# Patient Record
Sex: Male | Born: 1960 | Race: White | Hispanic: No | Marital: Married | Smoking: Never smoker
Health system: Southern US, Community
[De-identification: ages and names within clinical notes are randomized; demographics above are authoritative.]

## PROBLEM LIST (undated history)

## (undated) DIAGNOSIS — J309 Allergic rhinitis, unspecified: Secondary | ICD-10-CM

## (undated) DIAGNOSIS — R55 Syncope and collapse: Secondary | ICD-10-CM

## (undated) DIAGNOSIS — E785 Hyperlipidemia, unspecified: Secondary | ICD-10-CM

## (undated) DIAGNOSIS — T7840XA Allergy, unspecified, initial encounter: Secondary | ICD-10-CM

## (undated) HISTORY — PX: VASECTOMY: SHX75

## (undated) HISTORY — DX: Hyperlipidemia, unspecified: E78.5

## (undated) HISTORY — PX: OTHER SURGICAL HISTORY: SHX169

## (undated) HISTORY — DX: Allergy, unspecified, initial encounter: T78.40XA

## (undated) HISTORY — DX: Allergic rhinitis, unspecified: J30.9

## (undated) HISTORY — PX: COLONOSCOPY: SHX174

## (undated) HISTORY — DX: Syncope and collapse: R55

---

## 2004-08-28 ENCOUNTER — Ambulatory Visit: Payer: Self-pay | Admitting: Internal Medicine

## 2004-09-18 ENCOUNTER — Ambulatory Visit: Payer: Self-pay | Admitting: Internal Medicine

## 2004-09-27 ENCOUNTER — Encounter: Admission: RE | Admit: 2004-09-27 | Discharge: 2004-12-26 | Payer: Self-pay | Admitting: Internal Medicine

## 2004-11-18 ENCOUNTER — Ambulatory Visit: Payer: Self-pay | Admitting: Internal Medicine

## 2005-02-20 ENCOUNTER — Ambulatory Visit: Payer: Self-pay | Admitting: Gastroenterology

## 2005-03-17 ENCOUNTER — Ambulatory Visit: Payer: Self-pay | Admitting: Gastroenterology

## 2005-07-09 ENCOUNTER — Ambulatory Visit: Payer: Self-pay | Admitting: Internal Medicine

## 2005-10-24 ENCOUNTER — Ambulatory Visit: Payer: Self-pay | Admitting: Internal Medicine

## 2005-10-28 ENCOUNTER — Ambulatory Visit: Payer: Self-pay | Admitting: Internal Medicine

## 2005-11-04 ENCOUNTER — Ambulatory Visit: Payer: Self-pay | Admitting: Internal Medicine

## 2006-02-04 ENCOUNTER — Ambulatory Visit: Payer: Self-pay | Admitting: Internal Medicine

## 2006-03-27 ENCOUNTER — Ambulatory Visit: Payer: Self-pay | Admitting: Internal Medicine

## 2006-05-01 ENCOUNTER — Ambulatory Visit: Payer: Self-pay | Admitting: Internal Medicine

## 2007-01-04 ENCOUNTER — Ambulatory Visit: Payer: Self-pay | Admitting: Internal Medicine

## 2007-01-04 LAB — CONVERTED CEMR LAB
AST: 28 units/L (ref 0–37)
Albumin: 3.7 g/dL (ref 3.5–5.2)
Alkaline Phosphatase: 48 units/L (ref 39–117)
BUN: 12 mg/dL (ref 6–23)
Basophils Relative: 0.5 % (ref 0.0–1.0)
CO2: 30 meq/L (ref 19–32)
Chloride: 109 meq/L (ref 96–112)
Creatinine, Ser: 0.9 mg/dL (ref 0.4–1.5)
HCT: 43.4 % (ref 39.0–52.0)
Hemoglobin: 15.1 g/dL (ref 13.0–17.0)
Ketones, ur: NEGATIVE mg/dL
Leukocytes, UA: NEGATIVE
Monocytes Absolute: 0.4 10*3/uL (ref 0.2–0.7)
Neutrophils Relative %: 38.5 % — ABNORMAL LOW (ref 43.0–77.0)
Nitrite: NEGATIVE
Potassium: 3.7 meq/L (ref 3.5–5.1)
RBC: 4.77 M/uL (ref 4.22–5.81)
RDW: 12.2 % (ref 11.5–14.6)
Specific Gravity, Urine: 1.025 (ref 1.000–1.03)
TSH: 1.22 microintl units/mL (ref 0.35–5.50)
Total Bilirubin: 1.1 mg/dL (ref 0.3–1.2)
Total Protein, Urine: NEGATIVE mg/dL
Total Protein: 6.2 g/dL (ref 6.0–8.3)
Urobilinogen, UA: 0.2 (ref 0.0–1.0)
VLDL: 17 mg/dL (ref 0–40)
pH: 6 (ref 5.0–8.0)

## 2007-01-07 ENCOUNTER — Ambulatory Visit: Payer: Self-pay | Admitting: Internal Medicine

## 2007-05-18 ENCOUNTER — Encounter: Payer: Self-pay | Admitting: Internal Medicine

## 2007-05-18 DIAGNOSIS — J309 Allergic rhinitis, unspecified: Secondary | ICD-10-CM

## 2007-05-18 DIAGNOSIS — E785 Hyperlipidemia, unspecified: Secondary | ICD-10-CM

## 2007-05-18 HISTORY — DX: Hyperlipidemia, unspecified: E78.5

## 2007-05-18 HISTORY — DX: Allergic rhinitis, unspecified: J30.9

## 2007-09-29 ENCOUNTER — Ambulatory Visit: Payer: Self-pay | Admitting: Internal Medicine

## 2007-09-29 DIAGNOSIS — N453 Epididymo-orchitis: Secondary | ICD-10-CM | POA: Insufficient documentation

## 2008-02-10 ENCOUNTER — Ambulatory Visit: Payer: Self-pay | Admitting: Internal Medicine

## 2008-02-10 LAB — CONVERTED CEMR LAB
ALT: 16 units/L (ref 0–53)
AST: 21 units/L (ref 0–37)
Albumin: 4.2 g/dL (ref 3.5–5.2)
Alkaline Phosphatase: 58 units/L (ref 39–117)
BUN: 13 mg/dL (ref 6–23)
Basophils Relative: 1 % (ref 0.0–1.0)
CO2: 30 meq/L (ref 19–32)
Chloride: 103 meq/L (ref 96–112)
Creatinine, Ser: 0.9 mg/dL (ref 0.4–1.5)
Eosinophils Absolute: 0.3 10*3/uL (ref 0.0–0.7)
Eosinophils Relative: 5.7 % — ABNORMAL HIGH (ref 0.0–5.0)
GFR calc non Af Amer: 97 mL/min
HDL: 31.2 mg/dL — ABNORMAL LOW (ref >39.0)
Hemoglobin, Urine: NEGATIVE
Leukocytes, UA: NEGATIVE
Lymphocytes Relative: 41.2 % (ref 12.0–46.0)
MCV: 90.5 fL (ref 78.0–100.0)
Monocytes Relative: 8.5 % (ref 3.0–12.0)
Neutrophils Relative %: 43.6 % (ref 43.0–77.0)
Nitrite: NEGATIVE
Platelets: 270 10*3/uL (ref 150–400)
Potassium: 4.3 meq/L (ref 3.5–5.1)
RBC: 5.25 M/uL (ref 4.22–5.81)
Specific Gravity, Urine: <=1.005 (ref 1.000–1.03)
Total CHOL/HDL Ratio: 7.7
Urine Glucose: NEGATIVE mg/dL
Urobilinogen, UA: 0.2 (ref 0.0–1.0)
VLDL: 27 mg/dL (ref 0–40)
WBC: 4.9 10*3/uL (ref 4.5–10.5)

## 2008-02-18 ENCOUNTER — Ambulatory Visit: Payer: Self-pay | Admitting: Internal Medicine

## 2008-09-25 ENCOUNTER — Telehealth: Payer: Self-pay | Admitting: Internal Medicine

## 2008-11-15 ENCOUNTER — Ambulatory Visit: Payer: Self-pay | Admitting: Internal Medicine

## 2008-11-15 DIAGNOSIS — J019 Acute sinusitis, unspecified: Secondary | ICD-10-CM

## 2009-02-20 ENCOUNTER — Ambulatory Visit: Payer: Self-pay | Admitting: Internal Medicine

## 2009-02-20 LAB — CONVERTED CEMR LAB
ALT: 20 units/L (ref 0–53)
AST: 26 units/L (ref 0–37)
Alkaline Phosphatase: 49 units/L (ref 39–117)
BUN: 11 mg/dL (ref 6–23)
Bilirubin Urine: NEGATIVE
Bilirubin, Direct: 0.2 mg/dL (ref 0.0–0.3)
CO2: 31 meq/L (ref 19–32)
Eosinophils Relative: 8.1 % — ABNORMAL HIGH (ref 0.0–5.0)
Glucose, Bld: 100 mg/dL — ABNORMAL HIGH (ref 70–99)
HDL: 41.6 mg/dL (ref >39.00)
Ketones, ur: NEGATIVE mg/dL
Lymphocytes Relative: 36.4 % (ref 12.0–46.0)
Monocytes Relative: 9 % (ref 3.0–12.0)
Neutrophils Relative %: 46 % (ref 43.0–77.0)
Nitrite: NEGATIVE
Platelets: 242 10*3/uL (ref 150.0–400.0)
Potassium: 4.2 meq/L (ref 3.5–5.1)
RBC: 4.86 M/uL (ref 4.22–5.81)
Sodium: 141 meq/L (ref 135–145)
Total Bilirubin: 1.1 mg/dL (ref 0.3–1.2)
Total CHOL/HDL Ratio: 4
Total Protein, Urine: NEGATIVE mg/dL
Urine Glucose: NEGATIVE mg/dL
VLDL: 20.6 mg/dL (ref 0.0–40.0)
WBC: 5.8 10*3/uL (ref 4.5–10.5)
pH: 7 (ref 5.0–8.0)

## 2009-02-23 ENCOUNTER — Ambulatory Visit: Payer: Self-pay | Admitting: Internal Medicine

## 2009-09-26 ENCOUNTER — Telehealth: Payer: Self-pay | Admitting: Internal Medicine

## 2010-03-04 ENCOUNTER — Encounter (INDEPENDENT_AMBULATORY_CARE_PROVIDER_SITE_OTHER): Payer: Self-pay | Admitting: *Deleted

## 2010-06-25 ENCOUNTER — Ambulatory Visit: Payer: Self-pay | Admitting: Internal Medicine

## 2010-06-25 LAB — CONVERTED CEMR LAB
ALT: 14 units/L (ref 0–53)
BUN: 15 mg/dL (ref 6–23)
Basophils Relative: 0.7 % (ref 0.0–3.0)
CO2: 31 meq/L (ref 19–32)
Chloride: 105 meq/L (ref 96–112)
Cholesterol: 208 mg/dL — ABNORMAL HIGH (ref 0–200)
Direct LDL: 149.6 mg/dL
Eosinophils Relative: 5.9 % — ABNORMAL HIGH (ref 0.0–5.0)
HCT: 45.9 % (ref 39.0–52.0)
Lymphs Abs: 2.2 10*3/uL (ref 0.7–4.0)
MCV: 91.3 fL (ref 78.0–100.0)
Monocytes Absolute: 0.5 10*3/uL (ref 0.1–1.0)
Platelets: 273 10*3/uL (ref 150.0–400.0)
Potassium: 4.4 meq/L (ref 3.5–5.1)
Specific Gravity, Urine: 1.01 (ref 1.000–1.030)
Total Bilirubin: 1.1 mg/dL (ref 0.3–1.2)
Total Protein, Urine: NEGATIVE mg/dL
Total Protein: 6.9 g/dL (ref 6.0–8.3)
Triglycerides: 91 mg/dL (ref 0.0–149.0)
Urine Glucose: NEGATIVE mg/dL
WBC: 5.8 10*3/uL (ref 4.5–10.5)

## 2010-07-22 ENCOUNTER — Telehealth: Payer: Self-pay | Admitting: Internal Medicine

## 2010-09-20 ENCOUNTER — Encounter: Payer: Self-pay | Admitting: Internal Medicine

## 2010-10-22 NOTE — Progress Notes (Signed)
Summary: Lab results  Phone Note Call from Patient Call back at Home Phone [phone]   Summary of Call: Pt called for labs results because the info was not available on the phone tree. Initial call taken by: Jarome Lamas,  July 22, 2010 8:48 AM  Follow-up for Phone Call        called patient and left message for him to call back. Follow-up by: Jarome Lamas,  July 22, 2010 9:00 AM  Additional Follow-up for Phone Call Additional follow up Details #1::        Pt informed Additional Follow-up by: Margaret Pyle, CMA,  July 22, 2010 10:04 AM

## 2010-10-22 NOTE — Letter (Signed)
Summary: Colonoscopy Letter  [facility]  [address] [place], [place] [address]   Phone: [phone]  Fax: [phone]      March 04, 2010 MRN: [id number]   [facility] [address] [place], [place]  [address]   Dear Mr. [facility],   According to your medical record, it is time for you to schedule a Colonoscopy. The American Cancer Society recommends this procedure as a method to detect early colon cancer. Patients with a family history of colon cancer, or a personal history of colon polyps or inflammatory bowel disease are at increased risk.  This letter has been generated based on the recommendations made at the time of your procedure. If you feel that in your particular situation this may no longer apply, please contact our office.  Please call our office at [phone] to schedule this appointment or to update your records at your earliest convenience.  Thank you for cooperating with [place] to provide you with the very best care possible.   Sincerely,  Barbette Hair. Arlyce Dice, M.D.  [facility] [phone]

## 2010-10-22 NOTE — Assessment & Plan Note (Signed)
Summary: FU ON MEDS/ LOV 02-2009 /NWS  #   Vital Signs:  Patient profile:   50 year old male Height:      69 inches Weight:      174.50 pounds BMI:     25.86 O2 Sat:      97 % on Room air Temp:     97.9 degrees F oral Pulse rate:   72 / minute BP sitting:   100 / 70  (left arm) Cuff size:   regular  Vitals Entered By: Zella Ball Ewing CMA (AAMA) (June 25, 2010 8:07 AM)  O2 Flow:  Room air  CC: followup on medication/RE   CC:  followup on medication/RE.  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   No fever, wt loss, night sweats, loss of appetite or other constitutional symptoms    Preventive Screening-Counseling & Management      Drug Use:  no.    Problems Prior to Update: 1)  Preventive Health Care  (ICD-V70.0) 2)  Sinusitis- Acute-nos  (ICD-461.9) 3)  Preventive Health Care  (ICD-V70.0) 4)  Family History of Alcoholism/addiction  (ICD-V61.41) 5)  Orchitis  (ICD-604.90) 6)  Hyperlipidemia  (ICD-272.4) 7)  Allergic Rhinitis  (ICD-477.9)  Medications Prior to Update: 1)  Crestor 10 Mg  Tabs (Rosuvastatin Calcium) .... Once Daily 2)  Desonide 0.05 %  Oint (Desonide) 3)  Flonase 50 Mcg/act  Susp (Fluticasone Propionate) .... As Needed 4)  Zyrtec Allergy 10 Mg  Tabs (Cetirizine Hcl) .... As Needed 5)  Optivar 0.05 %  Soln (Azelastine Hcl)  Current Medications (verified): 1)  Crestor 10 Mg  Tabs (Rosuvastatin Calcium) .Marland Kitchen.. 1 By Mouth Once Daily 2)  Desonide 0.05 %  Oint (Desonide) 3)   Flonase 50 Mcg/act  Susp (Fluticasone Propionate) .... 2 Spray/side Once Daily As Needed 4)  Zyrtec Allergy 10 Mg  Tabs (Cetirizine Hcl) .... 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 Family History of Alcoholism/Addiction - father sister with allergies mother with colon cancer, PVD grandfather with prostate cancer  Social History: Reviewed history from 02/18/2008 and no changes required. 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         all otherwise negative per pt -    Physical Exam  General:  alert and well-developed.   Head:  normocephalic and atraumatic.   Eyes:  vision grossly intact, pupils equal, and  pupils round.   Ears:  R ear normal and L ear normal.   Nose:  no external deformity and no nasal discharge.   Mouth:  no gingival abnormalities and pharynx pink and moist.   Neck:  supple and no masses.   Lungs:  normal respiratory effort and normal breath sounds.   Heart:  normal rate, regular rhythm, and no gallop.   Abdomen:  soft, non-tender, and normal bowel sounds.   Msk:  no joint tenderness and no joint swelling.   Extremities:  no edema, no ulcers  Neurologic:  cranial nerves II-XII intact and strength normal in all extremities.   Skin:  color normal and no rashes.   Psych:  not anxious appearing and not depressed appearing.     Impression & Recommendations:  Problem # 1:  Preventive Health Care (ICD-V70.0)  Overall doing well, age appropriate education and counseling updated and referral for appropriate preventive services done unless declined, immunizations up to date or declined, diet counseling done if overweight, urged to quit smoking if smokes , most recent labs reviewed and current ordered if appropriate, ecg reviewed or declined (interpretation per ECG scanned in the EMR if done); information regarding Medicare Prevention requirements given if appropriate; speciality referrals updated as appropriate   Orders: TLB-BMP (Basic Metabolic Panel-BMET) (80048-METABOL) TLB-CBC Platelet - w/Differential (85025-CBCD) TLB-Hepatic/Liver Function Pnl (80076-HEPATIC) TLB-Lipid Panel (80061-LIPID) TLB-PSA (Prostate Specific Antigen) (84153-PSA) TLB-TSH (Thyroid Stimulating Hormone) (84443-TSH) TLB-Udip w/ Micro (81001-URINE) Gastroenterology Referral (GI)  Problem # 2:  HYPERLIPIDEMIA (ICD-272.4)  His updated medication list for this problem includes:    Crestor 10 Mg Tabs (Rosuvastatin calcium) .Marland Kitchen... 1 by mouth once daily has been out of crestor for 4 days, to Continue all previous medications as before this visit , Pt to continue diet efforts, good med tolerance; to check labs  - goal LDL less than 100  Complete Medication List: 1)  Crestor 10 Mg Tabs (Rosuvastatin calcium) .Marland Kitchen.. 1 by mouth once daily 2)  Desonide 0.05 % Oint (Desonide) 3)  Flonase 50 Mcg/act Susp (Fluticasone propionate) .... 2 spray/side once daily as needed 4)  Zyrtec Allergy 10 Mg Tabs (Cetirizine hcl) .... As needed 5)  Optivar 0.05 % Soln (Azelastine hcl)  Other Orders: Admin 1st Vaccine (01751) Flu Vaccine 54yrs + (02585)  Patient Instructions: 1)  you had the flu shot today 2)  You will be contacted about the referral(s) to: colonoscopy 3)  Please go to the Lab in the basement for your blood and/or urine tests today 4)  Please call the number on the [facility] Card for results of your testing 5)  Continue all previous medications as before this visit  6)  Please schedule a follow-up appointment in 1 year or sooner if needed Prescriptions: CRESTOR 10 MG  TABS (ROSUVASTATIN CALCIUM) 1 by mouth once daily  #90 x 3   Entered and Authorized by:   Corwin Levins MD   Signed by:   Corwin Levins MD on 06/25/2010   Method used:   Electronically to        [facility] [address] (retail)  [address]       [place], [place]  [address]       Ph: [phone]       Fax: [phone]   RxID:   [id number] FLONASE 50 MCG/ACT  SUSP (FLUTICASONE PROPIONATE) 2 spray/side once daily as needed  #3 x 3   Entered and Authorized by:   Corwin Levins MD   Signed by:   Corwin Levins MD on 06/25/2010   Method used:   Print then Give to Patient   RxID:   [id number] CRESTOR 10 MG  TABS (ROSUVASTATIN CALCIUM) 1 by mouth once daily  #90 x 3   Entered and Authorized by:   Corwin Levins MD   Signed by:   Corwin Levins MD on 06/25/2010   Method used:   Print then Give to Patient   RxID:   [id number]   Flu Vaccine Consent Questions     Do you have a history of severe allergic reactions to this vaccine? no    Any prior history of allergic reactions to egg and/or  gelatin? no    Do you have a sensitivity to the preservative Thimersol? no    Do you have a past history of Guillan-Barre Syndrome? no    Do you currently have an acute febrile illness? no    Have you ever had a severe reaction to latex? no    Vaccine information given and explained to patient? yes    Are you currently pregnant? no    Lot Number:[id number]   Exp Date:03/22/2011   Site Given  Left Deltoid IMbflu

## 2010-10-22 NOTE — Progress Notes (Signed)
Summary: Rx request  Phone Note Call from Patient   Caller: Patient [phone] Summary of Call: pt called requesting new rx for Crestor be sent to local pharmacy instead of [facility] due to Insurance change. FRx sent, pt informed Initial call taken by: Margaret Pyle, CMA,  September 26, 2009 12:03 PM    Prescriptions: CRESTOR 10 MG  TABS (ROSUVASTATIN CALCIUM) once daily  #30 x 5   Entered by:   Margaret Pyle, CMA   Authorized by:   Corwin Levins MD   Signed by:   Margaret Pyle, CMA on 09/26/2009   Method used:   Electronically to        [facility] [address] (retail)       [address]       [place], [place]  [address]       Ph: [phone]       Fax: [phone]   RxID:   [id number] CRESTOR 10 MG  TABS (ROSUVASTATIN CALCIUM) once daily  #90 x 1   Entered by:   Margaret Pyle, CMA   Authorized by:   Corwin Levins MD   Signed by:   Margaret Pyle, CMA on 09/26/2009   Method used:   Electronically to        [facility] [address] (retail)       [address]       [place], [place]  [address]       Ph: [phone]       Fax: [phone]   RxID:   [id number]

## 2010-10-24 NOTE — Letter (Signed)
Summary: Referral - not able to see patient  [facility]  [address] [place], [place] [address]   Phone: [phone]  Fax: [phone]    September 20, 2010   Corwin Levins, M.D. [address] [place], [place] [address]   Re:   Richard Morrow DOB:  12-Oct-1960 MRN:   [id number]    Dear Dr. Jonny Ruiz:  Thank you for your kind referral of the above patient.  We have attempted to schedule the recommended procedure Screening Colonoscopy but have not been able to schedule because:   X  The patient was not available by phone and/or has not returned our calls.  ___ The patient declined to schedule the procedure at this time.  We appreciate the referral and hope that we will have the opportunity to treat this patient in the future.    Sincerely,    [facility] [phone]

## 2011-02-07 NOTE — Assessment & Plan Note (Signed)
[facility]                               PULMONARY OFFICE NOTE   NAME:Richard Morrow, Richard Morrow                     MRN:          [id number]  DATE:05/01/2006                            DOB:          05-22-1961    PROBLEM:  1. Chronic atopic dermatitis.  2. Allergic rhinitis.   HISTORY:  This gentleman returns for followup of primary complaint of rash  or burning on his lips.  He had been particularly concerned about the  possibility of a chronic yeast infection on his skin.  Skin testing had been  strongly positive for common inhalant allergens.  He stopped using the  Bounce fabric softer sheets in the dryer and has significantly cut-down on  his beer intake.  He again points out that his father is a heavy beer  drinker with a similar tendency towards dry-scaling skin, almost sounding  like ichthyosis.  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  He is using a Vaseline lip ointment which helps  substantially.  We reviewed exposure to drying agents including  antihistamines, emphasizing that if he needs this for nasal congestion and  nasal discharge he should use it intermittently or try other approaches.  He  is satisfied that his nasal congestion currently is doing well, recognizing  that this is not a peak allergy season.   MEDICATION:  1. Crestor.  2. Desonide ointment.  3. Triamcinolone 0.1% cream used occasionally.  4. Fish Oil.  5. Flonase.  6. Ketoconazole shampoo.  7. Zyrtec.  8. Optivar.   ALLERGIES:  No medication allergy.   OBJECTIVE:  SKIN:  His skin looks unremarkable.  Maybe just slightly on the  dry side but not cosmetically noticeable.  I do not notice any abnormality  of the lips.  His tongue is much less coated and seems to be clearing  compared with last visit.  VITAL SIGNS:  Weight 174 pounds.  Blood pressure  122/88.  Pulse regular 93.  Room air saturation 97%.  GENERAL:  He is a fit-appearing, comfortable appearing young-man.  HEENT:  Conjunctivae and nasal mucosa looked normal.  LUNGS:  Clear.  Pulse regular.   LABORATORY:  IgE level was within broad normal at 145.6 (4-098) but  certainly in a range consistent with atopic allergic response. The screen  for antibodies to common skin fungal agents often associated with allergic  dermatitis (Pityrosporum/Malassezia) was essentially negative/normal.  His  CBC in February had been unremarkable with a normal differential at that  time.   IMPRESSION:  1. Atopic dermatitis.  2. Dry skin.  3. Suspect an aggravating component related especially to steady beer      intake, improved since that has changed.  4. Seasonal allergic rhinitis with significant skin test positivity.   PLAN:  1. Environmental allergen exposure discussion.  2. Continue to minimize beer and alcohol ingestion.  3. Use Zyrtec only as needed to avoid over-drying.  4. Continue routine use of skin moisturizers.  5.  Try sample Singulair for seven days, recognizing that he is relatively      asymptomatic now and may not have much capacity to respond.  This can      be revisited later as needed.  6. For now I would be happy to see him again as needed.                                   Clinton D. Maple Hudson, MD, FCCP, FACP   CDY/MedQ  DD:  05/03/2006  DT:  05/04/2006  Job #:  [id number]

## 2011-02-07 NOTE — Assessment & Plan Note (Signed)
[facility]                               PULMONARY OFFICE NOTE   NAME:Richard Morrow, Richard Morrow                     MRN:          [id number]  DATE:03/27/2006                            DOB:          [birth year]    PROBLEM:  This is a 50 year old nonsmoker seen at the kind referral of Dr.  Jonny Ruiz in allergy consultation, concerned about a rash on his lips, persistent  or recurrent yeast infection in his throat and body surfaces.   HISTORY:  He says for 2 years or more he has been fighting yeast on his  scalp, groin, face, and lips.  His lips get swollen and red, especially in  the past year.  He saw Dr. Jorja Loa for dermatology evaluation, and patch  tests were negative.  He says skin biopsy diagnosed yeast.  He is worse in  the winter.  He wondered if there was any relation either to peanut butter  or a hair gel that he uses but was never able to demonstrate a connection.  He has had some history of allergic rhinitis, mostly perennial but also  seasonal for which he uses antihistamines.  He had dandruff as a child.  In  the past but no longer he says he had some discomfort eating shellfish with  tight feeling in his throat and tingling in his ears.  There has been no  history of asthma, but he does say that he has had eczema.   REVIEW OF SYSTEMS:  He denies shortness of breath, bleeding, adenopathy,  fever, sweats, hot swollen joints or change in weight, bowels or bladder.  Occasional nasal congestion.  He does not remember any adult Immunizations  except tetanus shots some years ago.  He denies any at-risk lifestyle or  potential exposure to HIV.   PAST MEDICAL HISTORY:  1.  Elevated cholesterol.  2.  Allergic rhinitis.  3.  He was evaluated in the past for depression.  4.  Treated with Depo-Medrol and prednisone for complaints of itching and      burning eyes in May of this year when he was also given Zyrtec, Flonase,      and Optivar.  5.  He has been  treated for chronica tinea, especially while on amoxicillin      and has had several visits because of skin rashes.   SOCIAL HISTORY:  Never smokes.  Drinks 2 beers a day, more on weekends,  otherwise Diet Coke.  He is married with children.  Works as a Health and safety inspector.  He had traveled by car to [place] and [place] in June and  was in [place] in May of last year.  He exercises and lifts weights  regularly.   FAMILY HISTORY:  Father had a history of dry skin, heavy alcohol use.  Sister with allergic rhinitis.   MEDICATIONS:  1.  Crestor.  2.  Desonide ointment.  3.  Triamcinolone cream 0.1%.  4.  Multivitamins.  5.  Fish oil.  6.  Ketoconazole shampoo.  7.  Clotrimazole.  8.  Betamethasone cream.  9.  Zyrtec.  10. Optivar.   ALLERGIES:  No medication allergy known.   OBJECTIVE:  VITAL SIGNS: Weight 172 pounds.  Blood pressure 100/72, pulse  regular at 80, room air saturation 98%.  GENERAL:  This is a muscular, fit man in no apparent distress.  HEENT: There is minimal grain to the skin immediately around his eyes, but  no scale or erythema, no conjunctival injection or thickening.  His tongue  is coated, especially posteriorly.  I cannot exclude some thrush by  appearance.  Pharynx is not red.  Voice quality is normal.  NECK:  There is no adenopathy.  LUNGS:  Clear to percussion and auscultation.  CARDIAC: Heart sounds are regular without murmur or gallop.  EXTREMITIES: There is no pitting of nails.   Skin test:  Positive histamine, negative diluent controls.  Strong positive  intradermal reaction, particularly to grass and weed pollens and  endodermals.  Significant intradermal test, particularly dust mite.  He had  a weak reaction to shellfish; otherwise the food test screening panel was  negative.   IMPRESSION:  1.  Probably chronic atopic dermatitis or mild eczema rather than a chronic      primary cutaneous yeast infection unless it turns out that he has a       limited immune deficiency such as common variable immunodeficiency      (SVID).  2.  Allergic rhinitis.  3.  Possible thrush.  4.  If he develops new problems, I will be tempted to get an HIV assay,      although he denies risk.   PLAN:  1.  He is going to have his wife stop using Bounce fabric softener sheets in      the drier because of the bacterial enzymes with potential      sensibilization.  2.  Use Neutrogena as a bath soap.  3.  IgE level.  4.  Specific IgE for Malassezia furfur (P. [place]).  5.  Dust and environmental precautions.  6.  He was given a Clarinex after the allergy testing today.  7.  Schedule return in 1 month, earlier p.r.n.   I appreciate the chance to meet him.                                   Clinton D. Maple Hudson, MD, [facility], FACP   CDY/MedQ  DD:  04/05/2006  DT:  04/05/2006  Job #:  [id number]   cc:   Corwin Levins, MD

## 2011-05-05 ENCOUNTER — Telehealth: Payer: Self-pay | Admitting: *Deleted

## 2011-05-05 NOTE — Telephone Encounter (Signed)
LEFT MESSAGE FOR PT TO CALL BACK TO SCHEDULE

## 2011-05-12 NOTE — Telephone Encounter (Signed)
Pt will call the offcie back, works out of town it will be 2 months before he can  call back.Marland KitchenMarland Kitchen

## 2011-08-11 ENCOUNTER — Other Ambulatory Visit: Payer: Self-pay | Admitting: Internal Medicine

## 2011-12-01 ENCOUNTER — Other Ambulatory Visit: Payer: Self-pay | Admitting: Internal Medicine

## 2012-01-16 ENCOUNTER — Other Ambulatory Visit: Payer: Self-pay | Admitting: Internal Medicine

## 2012-01-26 ENCOUNTER — Encounter: Payer: Self-pay | Admitting: Internal Medicine

## 2012-01-26 ENCOUNTER — Other Ambulatory Visit (INDEPENDENT_AMBULATORY_CARE_PROVIDER_SITE_OTHER): Payer: Self-pay

## 2012-01-26 ENCOUNTER — Telehealth: Payer: Self-pay

## 2012-01-26 DIAGNOSIS — Z0001 Encounter for general adult medical examination with abnormal findings: Secondary | ICD-10-CM | POA: Insufficient documentation

## 2012-01-26 DIAGNOSIS — Z0000 Encounter for general adult medical examination without abnormal findings: Secondary | ICD-10-CM | POA: Insufficient documentation

## 2012-01-26 LAB — CBC WITH DIFFERENTIAL/PLATELET
Eosinophils Absolute: 0.3 10*3/uL (ref 0.0–0.7)
Lymphocytes Relative: 35.2 % (ref 12.0–46.0)
MCHC: 33.8 g/dL (ref 30.0–36.0)
MCV: 91.9 fl (ref 78.0–100.0)
Monocytes Absolute: 0.6 10*3/uL (ref 0.1–1.0)
Neutrophils Relative %: 50.3 % (ref 43.0–77.0)
Platelets: 262 10*3/uL (ref 150.0–400.0)
RBC: 5.02 Mil/uL (ref 4.22–5.81)
WBC: 6.8 10*3/uL (ref 4.5–10.5)

## 2012-01-26 LAB — URINALYSIS, ROUTINE W REFLEX MICROSCOPIC
Hgb urine dipstick: NEGATIVE
Leukocytes, UA: NEGATIVE
Urine Glucose: NEGATIVE
Urobilinogen, UA: 0.2 (ref 0.0–1.0)

## 2012-01-26 LAB — BASIC METABOLIC PANEL
Chloride: 105 mEq/L (ref 96–112)
GFR: 97.67 mL/min (ref >60.00)
Potassium: 4.4 mEq/L (ref 3.5–5.1)
Sodium: 142 mEq/L (ref 135–145)

## 2012-01-26 LAB — PSA: PSA: 1.04 ng/mL (ref 0.10–4.00)

## 2012-01-26 LAB — LIPID PANEL
HDL: 48.5 mg/dL (ref >39.00)
Total CHOL/HDL Ratio: 4

## 2012-01-26 LAB — HEPATIC FUNCTION PANEL
ALT: 18 U/L (ref 0–53)
AST: 24 U/L (ref 0–37)
Alkaline Phosphatase: 52 U/L (ref 39–117)
Bilirubin, Direct: 0.1 mg/dL (ref 0.0–0.3)
Total Bilirubin: 1 mg/dL (ref 0.3–1.2)

## 2012-01-26 LAB — TSH: TSH: 1.01 u[IU]/mL (ref 0.35–5.50)

## 2012-01-26 NOTE — Telephone Encounter (Signed)
Put lab order in. 

## 2012-01-28 ENCOUNTER — Encounter: Payer: Self-pay | Admitting: Internal Medicine

## 2012-01-28 ENCOUNTER — Ambulatory Visit (INDEPENDENT_AMBULATORY_CARE_PROVIDER_SITE_OTHER): Payer: BC Managed Care – PPO | Admitting: Internal Medicine

## 2012-01-28 VITALS — BP 112/82 | HR 75 | Temp 98.4°F | Ht 69.0 in | Wt 174.5 lb

## 2012-01-28 DIAGNOSIS — Z800 Family history of malignant neoplasm of digestive organs: Secondary | ICD-10-CM

## 2012-01-28 DIAGNOSIS — Z0000 Encounter for general adult medical examination without abnormal findings: Secondary | ICD-10-CM

## 2012-01-28 MED ORDER — ASPIRIN 81 MG PO TBEC: 81.0000 mg | DELAYED_RELEASE_TABLET | Freq: Every day | ORAL | Status: AC

## 2012-01-28 MED ORDER — ROSUVASTATIN CALCIUM 10 MG PO TABS: 10.0000 mg | ORAL_TABLET | Freq: Every day | ORAL | Status: DC

## 2012-01-28 NOTE — Assessment & Plan Note (Addendum)

## 2012-01-28 NOTE — Progress Notes (Signed)
Subjective:    Patient ID: Richard Morrow, male    DOB: [birth year], 51 y.o.   MRN: [id number]  HPI  Here for wellness and f/u;  Overall doing ok;  Pt denies CP, worsening SOB, DOE, wheezing, orthopnea, PND, worsening LE edema, palpitations, dizziness or syncope.  Pt denies neurological change such as new Headache, facial or extremity weakness.  Pt denies polydipsia, polyuria, or low sugar symptoms. Pt states overall good compliance with treatment and medications, good tolerability, and trying to follow lower cholesterol diet.  Pt denies worsening depressive symptoms, suicidal ideation or panic. No fever, wt loss, night sweats, loss of appetite, or other constitutional symptoms.  Pt states good ability with ADL's, low fall risk, home safety reviewed and adequate, no significant changes in hearing or vision, and occasionally active with exercise.  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   Pt denies fever, wt loss, night sweats, loss of appetite, or other constitutional symptoms Past Medical History  Diagnosis Date  . HYPERLIPIDEMIA 05/18/2007    Qualifier: Diagnosis of  By: Dance CMA (AAMA), Kim    . ALLERGIC RHINITIS 05/18/2007    Qualifier: Diagnosis of  By: Dance CMA (AAMA), Kim     Past Surgical History  Procedure Date  . Rotater cuff repair     reports that he has never smoked. He has never used smokeless tobacco. He reports that he drinks alcohol. He reports that he does not use illicit drugs. family history includes Alcohol abuse in an unspecified family member; Allergic rhinitis in his sister; Colon cancer in his mother; and Prostate cancer in an unspecified family member. No Known Allergies Current Outpatient Prescriptions on File Prior to Visit  Medication Sig Dispense Refill  . DISCONTD: CRESTOR 10 MG tablet TAKE 1 TABLET BY MOUTH ONCE DAILY  30 tablet  0   Review of Systems Review of Systems  Constitutional: Negative for  diaphoresis, activity change, appetite change and unexpected weight change.  HENT: Negative for hearing loss, ear pain, facial swelling, mouth sores and neck stiffness.   Eyes: Negative for pain, redness and visual disturbance.  Respiratory: Negative for shortness of breath and wheezing.   Cardiovascular: Negative for chest pain and palpitations.  Gastrointestinal: Negative for diarrhea, blood in stool, abdominal distention and rectal pain.  Genitourinary: Negative for hematuria, flank pain and decreased urine volume.  Musculoskeletal: Negative for myalgias and joint swelling.  Skin: Negative for color change and wound.  Neurological: Negative for syncope and numbness.  Hematological: Negative for adenopathy.  Psychiatric/Behavioral: Negative for hallucinations, self-injury, decreased concentration and agitation.      Objective:   Physical Exam BP 112/82  Pulse 75  Temp(Src) 98.4 F (36.9 C) (Oral)  Ht 5\' 9"  (1.753 m)  Wt 174 lb 8 oz (79.153 kg)  BMI 25.77 kg/m2  SpO2 98% Physical Exam  VS noted Constitutional: Pt is oriented to person, place, and time. Appears well-developed and well-nourished.  HENT:  Head: Normocephalic and atraumatic.  Right Ear: External ear normal.  Left Ear: External ear normal.  Nose: Nose normal.  Mouth/Throat: Oropharynx is clear and moist.  Eyes: Conjunctivae and EOM are normal. Pupils are equal, round, and reactive to light.  Neck: Normal range of motion. Neck supple. No JVD present. No tracheal deviation present.  Cardiovascular: Normal rate, regular rhythm, normal heart sounds and intact distal pulses.   Pulmonary/Chest: Effort normal and breath sounds normal.  Abdominal: Soft. Bowel sounds are normal. There is  no tenderness.  Musculoskeletal: Normal range of motion. Exhibits no edema.  Lymphadenopathy:  Has no cervical adenopathy.  Neurological: Pt is alert and oriented to person, place, and time. Pt has normal reflexes. No cranial nerve deficit.  Motor/sens/dtr intact Skin: Skin is warm and dry. No rash noted.  Psychiatric:  Has  normal mood and affect. Behavior is normal.     Assessment & Plan:

## 2012-01-28 NOTE — Patient Instructions (Addendum)
Please start the Aspirin at 81 mg - 1 per day - COATED only Continue all other medications as before Your refills are done today You will be contacted regarding the referral for: colonoscopy Please return in 1 year for your yearly visit, or sooner if needed, with Lab testing done 3-5 days before

## 2012-02-02 ENCOUNTER — Encounter: Payer: Self-pay | Admitting: Gastroenterology

## 2012-03-01 ENCOUNTER — Ambulatory Visit (AMBULATORY_SURGERY_CENTER): Payer: BC Managed Care – PPO | Admitting: *Deleted

## 2012-03-01 ENCOUNTER — Encounter: Payer: Self-pay | Admitting: Gastroenterology

## 2012-03-01 VITALS — Ht 69.0 in | Wt 175.0 lb

## 2012-03-01 DIAGNOSIS — Z800 Family history of malignant neoplasm of digestive organs: Secondary | ICD-10-CM

## 2012-03-01 DIAGNOSIS — Z1211 Encounter for screening for malignant neoplasm of colon: Secondary | ICD-10-CM

## 2012-03-01 MED ORDER — PEG-KCL-NACL-NASULF-NA ASC-C 100 G PO SOLR: ORAL | Status: DC

## 2012-03-01 NOTE — Progress Notes (Signed)
PATIENT STATES DURING SHOULDER SX BEFORE IV INSERTION BP DROPPED AND HIS WIFE HAD TO KEEP TALKING TO HIM. NOTE PLACED IN APPOINTMENTS INFO. RM

## 2012-03-15 ENCOUNTER — Encounter: Payer: Self-pay | Admitting: Gastroenterology

## 2012-03-15 ENCOUNTER — Ambulatory Visit (AMBULATORY_SURGERY_CENTER): Payer: BC Managed Care – PPO | Admitting: Gastroenterology

## 2012-03-15 VITALS — BP 119/74 | HR 72 | Temp 98.5°F | Resp 20 | Ht 69.0 in | Wt 175.0 lb

## 2012-03-15 DIAGNOSIS — Z800 Family history of malignant neoplasm of digestive organs: Secondary | ICD-10-CM

## 2012-03-15 DIAGNOSIS — D126 Benign neoplasm of colon, unspecified: Secondary | ICD-10-CM

## 2012-03-15 DIAGNOSIS — Z1211 Encounter for screening for malignant neoplasm of colon: Secondary | ICD-10-CM

## 2012-03-15 MED ORDER — SODIUM CHLORIDE 0.9 % IV SOLN: 500.0000 mL | INTRAVENOUS | Status: DC

## 2012-03-15 NOTE — Patient Instructions (Addendum)
YOU HAD AN ENDOSCOPIC PROCEDURE TODAY AT THE [facility]: Refer to the procedure report that was given to you for any specific questions about what was found during the examination.  If the procedure report does not answer your questions, please call your gastroenterologist to clarify.  If you requested that your care partner not be given the details of your procedure findings, then the procedure report has been included in a sealed envelope for you to review at your convenience later.  YOU SHOULD EXPECT: Some feelings of bloating in the abdomen. Passage of more gas than usual.  Walking can help get rid of the air that was put into your GI tract during the procedure and reduce the bloating. If you had a lower endoscopy (such as a colonoscopy or flexible sigmoidoscopy) you may notice spotting of blood in your stool or on the toilet paper. If you underwent a bowel prep for your procedure, then you may not have a normal bowel movement for a few days.  DIET: Your first meal following the procedure should be a light meal and then it is ok to progress to your normal diet.  A half-sandwich or bowl of soup is an example of a good first meal.  Heavy or fried foods are harder to digest and may make you feel nauseous or bloated.  Likewise meals heavy in dairy and vegetables can cause extra gas to form and this can also increase the bloating.  Drink plenty of fluids but you should avoid alcoholic beverages for 24 hours.  ACTIVITY: Your care partner should take you home directly after the procedure.  You should plan to take it easy, moving slowly for the rest of the day.  You can resume normal activity the day after the procedure however you should NOT DRIVE or use heavy machinery for 24 hours (because of the sedation medicines used during the test).    SYMPTOMS TO REPORT IMMEDIATELY: A gastroenterologist can be reached at any hour.  During normal business hours, 8:30 AM to 5:00 PM Monday through Friday,  call [phone].  After hours and on weekends, please call the GI answering service at [phone] who will take a message and have the physician on call contact you.   Following lower endoscopy (colonoscopy or flexible sigmoidoscopy):  Excessive amounts of blood in the stool  Significant tenderness or worsening of abdominal pains  Swelling of the abdomen that is new, acute  Fever of 100F or higher  Following upper endoscopy (EGD)  Vomiting of blood or coffee ground material  New chest pain or pain under the shoulder blades  Painful or persistently difficult swallowing  New shortness of breath  Fever of 100F or higher  Black, tarry-looking stools  FOLLOW UP: If any biopsies were taken you will be contacted by phone or by letter within the next 1-3 weeks.  Call your gastroenterologist if you have not heard about the biopsies in 3 weeks.  Our staff will call the home number listed on your records the next business day following your procedure to check on you and address any questions or concerns that you may have at that time regarding the information given to you following your procedure. This is a courtesy call and so if there is no answer at the home number and we have not heard from you through the emergency physician on call, we will assume that you have returned to your regular daily activities without incident.  SIGNATURES/CONFIDENTIALITY: You and/or your care   partner have signed paperwork which will be entered into your electronic medical record.  These signatures attest to the fact that that the information above on your After Visit Summary has been reviewed and is understood.  Full responsibility of the confidentiality of this discharge information lies with you and/or your care-partner.  

## 2012-03-15 NOTE — Op Note (Signed)
[facility] [address] [place], [place]  [address]  COLONOSCOPY PROCEDURE REPORT  PATIENT:  Richard, Morrow  MR#:  [id number] BIRTHDATE:  1961/04/05, 50 yrs. old  GENDER:  male ENDOSCOPIST:  Richard Hair. Arlyce Dice, MD REF. BY: PROCEDURE DATE:  03/15/2012 PROCEDURE:  Colonoscopy with snare polypectomy, Colon with cold biopsy polypectomy ASA CLASS:  Class I INDICATIONS:  Screening, family history of colon cancer Mother MEDICATIONS:   MAC sedation, administered by CRNA propofol 250mg IV  DESCRIPTION OF PROCEDURE:   After the risks benefits and alternatives of the procedure were thoroughly explained, informed consent was obtained.  Digital rectal exam was performed and revealed no abnormalities.   The LB [id number]-[id number] endoscope was introduced through the anus and advanced to the cecum, which was identified by both the appendix and ileocecal valve, without limitations.  The quality of the prep was excellent, using MiraLax.  The instrument was then slowly withdrawn as the colon was fully examined. <<PROCEDUREIMAGES>>  FINDINGS:  A sessile polyp was found in the sigmoid colon. It was 10 mm in size. It was found 20 cm from the point of entry. Polyp was snared, then cauterized with monopolar cautery. Retrieval was successful (see image7). snare polyp  A sessile polyp was found in the descending colon. It was 3 mm in size. Polyp was snared without cautery. Retrieval was successful (see image1). snare polyp  A diminutive polyp was found in the descending colon. It was 2 mm in size. The polyp was removed using cold biopsy forceps (see image6).  This was otherwise a normal examination of the colon (see image3 and image8).   Retroflexed views in the rectum revealed no abnormalities.    The time to cecum =  1) 3.0 minutes. The scope was then withdrawn in  1) 7.25  minutes from the cecum and the procedure completed. COMPLICATIONS:  None ENDOSCOPIC IMPRESSION: 1) 10 mm sessile  polyp in the sigmoid colon 2) 3 mm sessile polyp in the descending colon 3) 2 mm diminutive polyp in the descending colon 4) Otherwise normal examination RECOMMENDATIONS: 1) Given your significant family history of colon cancer, you should have a repeat colonoscopy in 5 years REPEAT EXAM:  In 5 year(s) for Colonoscopy.  ______________________________ Richard Hair. Arlyce Dice, MD  CC:  Corwin Levins, MD  n. Rosalie Doctor:   Richard Hair. Demarko Zeimet at 03/15/2012 09:17 AM  Clementeen Hoof, [id number]

## 2012-03-15 NOTE — Progress Notes (Signed)
Patient did not experience any of the following events: a burn prior to discharge; a fall within the facility; wrong site/side/patient/procedure/implant event; or a hospital transfer or hospital admission upon discharge from the facility. (G8907) Patient did not have preoperative order for IV antibiotic SSI prophylaxis. (G8918)  

## 2012-03-15 NOTE — Progress Notes (Signed)
Propofol administered per Silverio Decamp CRNA

## 2012-03-16 ENCOUNTER — Telehealth: Payer: Self-pay

## 2012-03-16 NOTE — Telephone Encounter (Signed)
Left message

## 2012-03-18 ENCOUNTER — Encounter: Payer: Self-pay | Admitting: Gastroenterology

## 2012-07-05 ENCOUNTER — Telehealth: Payer: Self-pay

## 2012-07-05 MED ORDER — ATORVASTATIN CALCIUM 20 MG PO TABS: 20.0000 mg | ORAL_TABLET | Freq: Every day | ORAL | Status: DC

## 2012-07-05 NOTE — Telephone Encounter (Signed)
Patient informed. 

## 2012-07-05 NOTE — Telephone Encounter (Signed)
Done erx - changed to lipitor 20

## 2012-07-05 NOTE — Telephone Encounter (Signed)
Pt called requesting a generic alternative to Crestor due to insurance change.

## 2013-09-27 ENCOUNTER — Encounter: Payer: Self-pay | Admitting: Internal Medicine

## 2013-09-30 ENCOUNTER — Ambulatory Visit (INDEPENDENT_AMBULATORY_CARE_PROVIDER_SITE_OTHER): Payer: BC Managed Care – PPO | Admitting: Internal Medicine

## 2013-09-30 ENCOUNTER — Encounter: Payer: Self-pay | Admitting: Internal Medicine

## 2013-09-30 ENCOUNTER — Other Ambulatory Visit (INDEPENDENT_AMBULATORY_CARE_PROVIDER_SITE_OTHER): Payer: BC Managed Care – PPO

## 2013-09-30 VITALS — BP 124/84 | HR 70 | Temp 97.8°F | Ht 70.0 in | Wt 176.0 lb

## 2013-09-30 DIAGNOSIS — E785 Hyperlipidemia, unspecified: Secondary | ICD-10-CM

## 2013-09-30 DIAGNOSIS — Z0000 Encounter for general adult medical examination without abnormal findings: Secondary | ICD-10-CM

## 2013-09-30 LAB — BASIC METABOLIC PANEL
BUN: 18 mg/dL (ref 6–23)
CHLORIDE: 104 meq/L (ref 96–112)
CO2: 30 mEq/L (ref 19–32)
Calcium: 9.5 mg/dL (ref 8.4–10.5)
Creatinine, Ser: 1.2 mg/dL (ref 0.4–1.5)
GFR: 69.15 mL/min (ref >60.00)
GLUCOSE: 85 mg/dL (ref 70–99)
POTASSIUM: 4.2 meq/L (ref 3.5–5.1)
Sodium: 140 mEq/L (ref 135–145)

## 2013-09-30 LAB — HEPATIC FUNCTION PANEL
ALT: 17 U/L (ref 0–53)
AST: 18 U/L (ref 0–37)
Albumin: 4.6 g/dL (ref 3.5–5.2)
Alkaline Phosphatase: 54 U/L (ref 39–117)
Bilirubin, Direct: 0.1 mg/dL (ref 0.0–0.3)
TOTAL PROTEIN: 7.3 g/dL (ref 6.0–8.3)
Total Bilirubin: 0.8 mg/dL (ref 0.3–1.2)

## 2013-09-30 LAB — CBC WITH DIFFERENTIAL/PLATELET
BASOS PCT: 0.5 % (ref 0.0–3.0)
Basophils Absolute: 0 10*3/uL (ref 0.0–0.1)
EOS ABS: 0.3 10*3/uL (ref 0.0–0.7)
Eosinophils Relative: 4.5 % (ref 0.0–5.0)
HCT: 46.1 % (ref 39.0–52.0)
Hemoglobin: 15.8 g/dL (ref 13.0–17.0)
LYMPHS PCT: 42 % (ref 12.0–46.0)
Lymphs Abs: 2.8 10*3/uL (ref 0.7–4.0)
MCHC: 34.3 g/dL (ref 30.0–36.0)
MCV: 90.4 fl (ref 78.0–100.0)
MONOS PCT: 8.7 % (ref 3.0–12.0)
Monocytes Absolute: 0.6 10*3/uL (ref 0.1–1.0)
Neutro Abs: 2.9 10*3/uL (ref 1.4–7.7)
Neutrophils Relative %: 44.3 % (ref 43.0–77.0)
Platelets: 303 10*3/uL (ref 150.0–400.0)
RBC: 5.11 Mil/uL (ref 4.22–5.81)
RDW: 13 % (ref 11.5–14.6)
WBC: 6.7 10*3/uL (ref 4.5–10.5)

## 2013-09-30 LAB — LIPID PANEL
CHOLESTEROL: 170 mg/dL (ref 0–200)
HDL: 39.3 mg/dL (ref >39.00)
LDL Cholesterol: 112 mg/dL — ABNORMAL HIGH (ref 0–99)
Total CHOL/HDL Ratio: 4
Triglycerides: 93 mg/dL (ref 0.0–149.0)
VLDL: 18.6 mg/dL (ref 0.0–40.0)

## 2013-09-30 LAB — URINALYSIS, ROUTINE W REFLEX MICROSCOPIC
Bilirubin Urine: NEGATIVE
Hgb urine dipstick: NEGATIVE
KETONES UR: NEGATIVE
LEUKOCYTES UA: NEGATIVE
Nitrite: NEGATIVE
PH: 6.5 (ref 5.0–8.0)
Specific Gravity, Urine: <=1.005 — AB (ref 1.000–1.030)
Total Protein, Urine: NEGATIVE
URINE GLUCOSE: NEGATIVE
Urobilinogen, UA: 0.2 (ref 0.0–1.0)

## 2013-09-30 LAB — PSA: PSA: 0.96 ng/mL (ref 0.10–4.00)

## 2013-09-30 LAB — TSH: TSH: 1.56 u[IU]/mL (ref 0.35–5.50)

## 2013-09-30 NOTE — Progress Notes (Signed)
Subjective:    Patient ID: Richard Morrow, male    DOB: June 15, 1961, 53 y.o.   MRN: [id number]  HPI  Here for wellness and f/u;  Overall doing ok;  Pt denies CP, worsening SOB, DOE, wheezing, orthopnea, PND, worsening LE edema, palpitations, dizziness or syncope.  Pt denies neurological change such as new headache, facial or extremity weakness.  Pt denies polydipsia, polyuria, or low sugar symptoms. Pt states overall good compliance with treatment and medications, good tolerability, and has been trying to follow lower cholesterol diet.  Pt denies worsening depressive symptoms, suicidal ideation or panic. No fever, night sweats, wt loss, loss of appetite, or other constitutional symptoms.  Pt states good ability with ADL's, has low fall risk, home safety reviewed and adequate, no other significant changes in hearing or vision, and only occasionally active with exercise.  Father and uncle died recently with heart disease/heavy smokers. No acute complaints Past Medical History  Diagnosis Date  . HYPERLIPIDEMIA 05/18/2007    Qualifier: Diagnosis of  By: Dance CMA ([place]), Kim    . ALLERGIC RHINITIS 05/18/2007    Qualifier: Diagnosis of  By: Dance CMA (AAMA), Kim     Past Surgical History  Procedure Laterality Date  . Rotater cuff repair      left  . Colonoscopy      reports that he has never smoked. He has never used smokeless tobacco. He reports that he drinks alcohol. He reports that he does not use illicit drugs. family history includes Alcohol abuse in an other family member; Allergic rhinitis in his sister; Colon cancer (age of onset: 22) in his mother; Prostate cancer in an other family member. There is no history of Esophageal cancer, Rectal cancer, or Stomach cancer. No Known Allergies No current outpatient prescriptions on file prior to visit.   No current facility-administered medications on file prior to visit.   Review of Systems Constitutional: Negative for diaphoresis, activity  change, appetite change or unexpected weight change.  HENT: Negative for hearing loss, ear pain, facial swelling, mouth sores and neck stiffness.   Eyes: Negative for pain, redness and visual disturbance.  Respiratory: Negative for shortness of breath and wheezing.   Cardiovascular: Negative for chest pain and palpitations.  Gastrointestinal: Negative for diarrhea, blood in stool, abdominal distention or other pain Genitourinary: Negative for hematuria, flank pain or change in urine volume.  Musculoskeletal: Negative for myalgias and joint swelling.  Skin: Negative for color change and wound.  Neurological: Negative for syncope and numbness. other than noted Hematological: Negative for adenopathy.  Psychiatric/Behavioral: Negative for hallucinations, self-injury, decreased concentration and agitation.      Objective:   Physical Exam BP 124/84  Pulse 70  Temp(Src) 97.8 F (36.6 C) (Oral)  Ht 5\' 10"  (1.778 m)  Wt 176 lb (79.833 kg)  BMI 25.25 kg/m2 VS noted,  Constitutional: Pt is oriented to person, place, and time. Appears well-developed and well-nourished.  Head: Normocephalic and atraumatic.  Right Ear: External ear normal.  Left Ear: External ear normal.  Nose: Nose normal.  Mouth/Throat: Oropharynx is clear and moist.  Eyes: Conjunctivae and EOM are normal. Pupils are equal, round, and reactive to light.  Neck: Normal range of motion. Neck supple. No JVD present. No tracheal deviation present.  Cardiovascular: Normal rate, regular rhythm, normal heart sounds and intact distal pulses.   Pulmonary/Chest: Effort normal and breath sounds normal.  Abdominal: Soft. Bowel sounds are normal. There is no tenderness. No HSM  Musculoskeletal: Normal range of  motion. Exhibits no edema.  Lymphadenopathy:  Has no cervical adenopathy.  Neurological: Pt is alert and oriented to person, place, and time. Pt has normal reflexes. No cranial nerve deficit.  Skin: Skin is warm and dry. No rash  noted.  Psychiatric:  Has  normal mood and affect. Behavior is normal.     Assessment & Plan:

## 2013-09-30 NOTE — Patient Instructions (Signed)
Please continue all other medications as before, and refills have been done if requested. Please have the pharmacy call with any other refills you may need.  Please continue your efforts at being more active, low cholesterol diet, and weight control.  You are otherwise up to date with prevention measures today.  Please go to the LAB in the Basement (turn left off the elevator) for the tests to be done today  You will be contacted by phone if any changes need to be made immediately.  Otherwise, you will receive a letter about your results with an explanation, but please check with MyChart first.  Please remember to sign up for MyChart if you have not done so, as this will be important to you in the future with finding out test results, communicating by private email, and scheduling acute appointments online when needed.  Please return in 1 year for your yearly visit, or sooner if needed, with Lab testing done 3-5 days before  

## 2013-09-30 NOTE — Assessment & Plan Note (Signed)

## 2013-09-30 NOTE — Progress Notes (Signed)
Pre visit review using our clinic review tool, if applicable. No additional management support is needed unless otherwise documented below in the visit note. 

## 2013-10-02 NOTE — Assessment & Plan Note (Signed)
For lipitor 20 qd,  to f/u any worsening symptoms or concerns  Lab Results  Component Value Date   CHOL 170 09/30/2013   HDL 39.30 09/30/2013   LDLCALC 112* 09/30/2013   LDLDIRECT 149.6 06/25/2010   TRIG 93.0 09/30/2013   CHOLHDL 4 09/30/2013

## 2013-10-27 ENCOUNTER — Other Ambulatory Visit: Payer: Self-pay | Admitting: Internal Medicine

## 2014-10-05 ENCOUNTER — Other Ambulatory Visit: Payer: BLUE CROSS/BLUE SHIELD

## 2014-10-05 ENCOUNTER — Other Ambulatory Visit (INDEPENDENT_AMBULATORY_CARE_PROVIDER_SITE_OTHER): Payer: BLUE CROSS/BLUE SHIELD

## 2014-10-05 ENCOUNTER — Ambulatory Visit (INDEPENDENT_AMBULATORY_CARE_PROVIDER_SITE_OTHER): Payer: BLUE CROSS/BLUE SHIELD | Admitting: Internal Medicine

## 2014-10-05 ENCOUNTER — Encounter: Payer: Self-pay | Admitting: Internal Medicine

## 2014-10-05 VITALS — BP 122/82 | HR 73 | Temp 98.1°F | Ht 69.0 in | Wt 172.1 lb

## 2014-10-05 DIAGNOSIS — Z0189 Encounter for other specified special examinations: Secondary | ICD-10-CM

## 2014-10-05 DIAGNOSIS — Z0000 Encounter for general adult medical examination without abnormal findings: Secondary | ICD-10-CM

## 2014-10-05 DIAGNOSIS — E785 Hyperlipidemia, unspecified: Secondary | ICD-10-CM

## 2014-10-05 LAB — CBC WITH DIFFERENTIAL/PLATELET
BASOS ABS: 0 10*3/uL (ref 0.0–0.1)
Basophils Relative: 0.7 % (ref 0.0–3.0)
Eosinophils Absolute: 0.2 10*3/uL (ref 0.0–0.7)
Eosinophils Relative: 3.3 % (ref 0.0–5.0)
HCT: 47 % (ref 39.0–52.0)
HEMOGLOBIN: 15.7 g/dL (ref 13.0–17.0)
LYMPHS PCT: 39.9 % (ref 12.0–46.0)
Lymphs Abs: 2.3 10*3/uL (ref 0.7–4.0)
MCHC: 33.5 g/dL (ref 30.0–36.0)
MCV: 92 fl (ref 78.0–100.0)
MONO ABS: 0.5 10*3/uL (ref 0.1–1.0)
Monocytes Relative: 8.4 % (ref 3.0–12.0)
NEUTROS ABS: 2.7 10*3/uL (ref 1.4–7.7)
Neutrophils Relative %: 47.7 % (ref 43.0–77.0)
PLATELETS: 286 10*3/uL (ref 150.0–400.0)
RBC: 5.1 Mil/uL (ref 4.22–5.81)
RDW: 13.3 % (ref 11.5–15.5)
WBC: 5.7 10*3/uL (ref 4.0–10.5)

## 2014-10-05 LAB — TSH: TSH: 1.11 u[IU]/mL (ref 0.35–4.50)

## 2014-10-05 LAB — BASIC METABOLIC PANEL
BUN: 12 mg/dL (ref 6–23)
CALCIUM: 9.9 mg/dL (ref 8.4–10.5)
CHLORIDE: 101 meq/L (ref 96–112)
CO2: 30 mEq/L (ref 19–32)
CREATININE: 0.88 mg/dL (ref 0.40–1.50)
GFR: 96.63 mL/min (ref >60.00)
Glucose, Bld: 69 mg/dL — ABNORMAL LOW (ref 70–99)
Potassium: 4.4 mEq/L (ref 3.5–5.1)
SODIUM: 138 meq/L (ref 135–145)

## 2014-10-05 LAB — URINALYSIS, ROUTINE W REFLEX MICROSCOPIC
BILIRUBIN URINE: NEGATIVE
Hgb urine dipstick: NEGATIVE
KETONES UR: NEGATIVE
LEUKOCYTES UA: NEGATIVE
NITRITE: NEGATIVE
PH: 7 (ref 5.0–8.0)
RBC / HPF: NONE SEEN (ref 0–?)
Specific Gravity, Urine: <=1.005 — AB (ref 1.000–1.030)
Total Protein, Urine: NEGATIVE
Urine Glucose: NEGATIVE
Urobilinogen, UA: 0.2 (ref 0.0–1.0)
WBC UA: NONE SEEN (ref 0–?)

## 2014-10-05 LAB — LIPID PANEL
CHOL/HDL RATIO: 4
Cholesterol: 165 mg/dL (ref 0–200)
HDL: 46 mg/dL (ref >39.00)
LDL Cholesterol: 98 mg/dL (ref 0–99)
NONHDL: 119
Triglycerides: 103 mg/dL (ref 0.0–149.0)
VLDL: 20.6 mg/dL (ref 0.0–40.0)

## 2014-10-05 LAB — PSA: PSA: 0.93 ng/mL (ref 0.10–4.00)

## 2014-10-05 LAB — HEPATIC FUNCTION PANEL
ALT: 13 U/L (ref 0–53)
AST: 19 U/L (ref 0–37)
Albumin: 4.6 g/dL (ref 3.5–5.2)
Alkaline Phosphatase: 59 U/L (ref 39–117)
Bilirubin, Direct: 0.2 mg/dL (ref 0.0–0.3)
TOTAL PROTEIN: 7.2 g/dL (ref 6.0–8.3)
Total Bilirubin: 0.8 mg/dL (ref 0.2–1.2)

## 2014-10-05 NOTE — Patient Instructions (Signed)
Please continue all other medications as before, and refills have been done if requested.  Please have the pharmacy call with any other refills you may need.  Please continue your efforts at being more active, low cholesterol diet, and weight control.  You are otherwise up to date with prevention measures today.  Please keep your appointments with your specialists as you may have planned  Please return in 1 year for your yearly visit, or sooner if needed, with Lab testing done 3-5 days before  

## 2014-10-05 NOTE — Progress Notes (Signed)
Pre visit review using our clinic review tool, if applicable. No additional management support is needed unless otherwise documented below in the visit note. 

## 2014-10-05 NOTE — Progress Notes (Signed)
Subjective:    Patient ID: Richard Morrow, male    DOB: 1961-08-17, 54 y.o.   MRN: [id number]  HPI Here for wellness and f/u;  Overall doing ok;  Pt denies CP, worsening SOB, DOE, wheezing, orthopnea, PND, worsening LE edema, palpitations, dizziness or syncope.  Pt denies neurological change such as new headache, facial or extremity weakness.  Pt denies polydipsia, polyuria, or low sugar symptoms. Pt states overall good compliance with treatment and medications, good tolerability, and has been trying to follow lower cholesterol diet.  Pt denies worsening depressive symptoms, suicidal ideation or panic. No fever, night sweats, wt loss, loss of appetite, or other constitutional symptoms.  Pt states good ability with ADL's, has low fall risk, home safety reviewed and adequate, no other significant changes in hearing or vision, and only occasionally active with exercise.  No current complaints Past Medical History  Diagnosis Date  . HYPERLIPIDEMIA 05/18/2007    Qualifier: Diagnosis of  By: Dance CMA ([place]), Kim    . ALLERGIC RHINITIS 05/18/2007    Qualifier: Diagnosis of  By: Dance CMA (AAMA), Kim     Past Surgical History  Procedure Laterality Date  . Rotater cuff repair      left  . Colonoscopy      reports that he has never smoked. He has never used smokeless tobacco. He reports that he drinks alcohol. He reports that he does not use illicit drugs. family history includes Alcohol abuse in an other family member; Allergic rhinitis in his sister; Colon cancer (age of onset: 57) in his mother; Prostate cancer in an other family member. There is no history of Esophageal cancer, Rectal cancer, or Stomach cancer. No Known Allergies Current Outpatient Prescriptions on File Prior to Visit  Medication Sig Dispense Refill  . atorvastatin (LIPITOR) 20 MG tablet TAKE 1 TABLET (20 MG TOTAL) BY MOUTH DAILY. 90 tablet 3   No current facility-administered medications on file prior to visit.   Review of  Systems Constitutional: Negative for increased diaphoresis, other activity, appetite or other siginficant weight change  HENT: Negative for worsening hearing loss, ear pain, facial swelling, mouth sores and neck stiffness.   Eyes: Negative for other worsening pain, redness or visual disturbance.  Respiratory: Negative for shortness of breath and wheezing.   Cardiovascular: Negative for chest pain and palpitations.  Gastrointestinal: Negative for diarrhea, blood in stool, abdominal distention or other pain Genitourinary: Negative for hematuria, flank pain or change in urine volume.  Musculoskeletal: Negative for myalgias or other joint complaints.  Skin: Negative for color change and wound.  Neurological: Negative for syncope and numbness. other than noted Hematological: Negative for adenopathy. or other swelling Psychiatric/Behavioral: Negative for hallucinations, self-injury, decreased concentration or other worsening agitation.      Objective:   Physical Exam BP 122/82 mmHg  Pulse 73  Temp(Src) 98.1 F (36.7 C) (Oral)  Ht 5\' 9"  (1.753 m)  Wt 172 lb 2 oz (78.075 kg)  BMI 25.41 kg/m2  SpO2 96% VS noted,  Constitutional: Pt is oriented to person, place, and time. Appears well-developed and well-nourished.  Head: Normocephalic and atraumatic.  Right Ear: External ear normal.  Left Ear: External ear normal.  Nose: Nose normal.  Mouth/Throat: Oropharynx is clear and moist.  Eyes: Conjunctivae and EOM are normal. Pupils are equal, round, and reactive to light.  Neck: Normal range of motion. Neck supple. No JVD present. No tracheal deviation present.  Cardiovascular: Normal rate, regular rhythm, normal heart sounds and intact distal  pulses.   Pulmonary/Chest: Effort normal and breath sounds without rales or wheezing  Abdominal: Soft. Bowel sounds are normal. NT. No HSM  Musculoskeletal: Normal range of motion. Exhibits no edema.  Lymphadenopathy:  Has no cervical adenopathy.    Neurological: Pt is alert and oriented to person, place, and time. Pt has normal reflexes. No cranial nerve deficit. Motor grossly intact Skin: Skin is warm and dry. No rash noted.  Psychiatric:  Has normal mood and affect. Behavior is normal.     Assessment & Plan:

## 2014-10-08 NOTE — Assessment & Plan Note (Signed)
Lab Results  Component Value Date   LDLCALC 98 10/05/2014   For lower chol diet, declines statin

## 2014-10-08 NOTE — Assessment & Plan Note (Signed)

## 2014-11-16 ENCOUNTER — Other Ambulatory Visit: Payer: Self-pay | Admitting: Internal Medicine

## 2015-04-20 ENCOUNTER — Encounter: Payer: Self-pay | Admitting: Gastroenterology

## 2015-10-12 ENCOUNTER — Other Ambulatory Visit (INDEPENDENT_AMBULATORY_CARE_PROVIDER_SITE_OTHER): Payer: BLUE CROSS/BLUE SHIELD

## 2015-10-12 ENCOUNTER — Encounter: Payer: Self-pay | Admitting: Internal Medicine

## 2015-10-12 ENCOUNTER — Ambulatory Visit (INDEPENDENT_AMBULATORY_CARE_PROVIDER_SITE_OTHER): Payer: BLUE CROSS/BLUE SHIELD | Admitting: Internal Medicine

## 2015-10-12 VITALS — BP 110/78 | HR 78 | Temp 98.6°F | Ht 69.0 in | Wt 171.0 lb

## 2015-10-12 DIAGNOSIS — L509 Urticaria, unspecified: Secondary | ICD-10-CM

## 2015-10-12 DIAGNOSIS — L989 Disorder of the skin and subcutaneous tissue, unspecified: Secondary | ICD-10-CM

## 2015-10-12 DIAGNOSIS — Z0000 Encounter for general adult medical examination without abnormal findings: Secondary | ICD-10-CM | POA: Diagnosis not present

## 2015-10-12 DIAGNOSIS — B351 Tinea unguium: Secondary | ICD-10-CM | POA: Insufficient documentation

## 2015-10-12 LAB — LIPID PANEL
CHOLESTEROL: 208 mg/dL — AB (ref 0–200)
HDL: 53.1 mg/dL (ref >39.00)
LDL Cholesterol: 138 mg/dL — ABNORMAL HIGH (ref 0–99)
NonHDL: 155.15
TRIGLYCERIDES: 86 mg/dL (ref 0.0–149.0)
Total CHOL/HDL Ratio: 4
VLDL: 17.2 mg/dL (ref 0.0–40.0)

## 2015-10-12 LAB — CBC WITH DIFFERENTIAL/PLATELET
BASOS ABS: 0.1 10*3/uL (ref 0.0–0.1)
Basophils Relative: 1 % (ref 0.0–3.0)
EOS ABS: 0.3 10*3/uL (ref 0.0–0.7)
Eosinophils Relative: 4.3 % (ref 0.0–5.0)
HEMATOCRIT: 48.5 % (ref 39.0–52.0)
HEMOGLOBIN: 16.3 g/dL (ref 13.0–17.0)
LYMPHS PCT: 39.7 % (ref 12.0–46.0)
Lymphs Abs: 2.5 10*3/uL (ref 0.7–4.0)
MCHC: 33.7 g/dL (ref 30.0–36.0)
MCV: 91.3 fl (ref 78.0–100.0)
MONOS PCT: 9.1 % (ref 3.0–12.0)
Monocytes Absolute: 0.6 10*3/uL (ref 0.1–1.0)
Neutro Abs: 2.9 10*3/uL (ref 1.4–7.7)
Neutrophils Relative %: 45.9 % (ref 43.0–77.0)
Platelets: 290 10*3/uL (ref 150.0–400.0)
RBC: 5.31 Mil/uL (ref 4.22–5.81)
RDW: 13.2 % (ref 11.5–15.5)
WBC: 6.3 10*3/uL (ref 4.0–10.5)

## 2015-10-12 LAB — HEPATIC FUNCTION PANEL
ALT: 16 U/L (ref 0–53)
AST: 22 U/L (ref 0–37)
Albumin: 4.7 g/dL (ref 3.5–5.2)
Alkaline Phosphatase: 55 U/L (ref 39–117)
BILIRUBIN TOTAL: 1.2 mg/dL (ref 0.2–1.2)
Bilirubin, Direct: 0.2 mg/dL (ref 0.0–0.3)
TOTAL PROTEIN: 7.3 g/dL (ref 6.0–8.3)

## 2015-10-12 LAB — BASIC METABOLIC PANEL
BUN: 12 mg/dL (ref 6–23)
CALCIUM: 9.7 mg/dL (ref 8.4–10.5)
CHLORIDE: 101 meq/L (ref 96–112)
CO2: 30 mEq/L (ref 19–32)
CREATININE: 0.88 mg/dL (ref 0.40–1.50)
GFR: 96.25 mL/min (ref >60.00)
Glucose, Bld: 84 mg/dL (ref 70–99)
Potassium: 4.5 mEq/L (ref 3.5–5.1)
Sodium: 139 mEq/L (ref 135–145)

## 2015-10-12 LAB — URINALYSIS, ROUTINE W REFLEX MICROSCOPIC
Bilirubin Urine: NEGATIVE
HGB URINE DIPSTICK: NEGATIVE
Ketones, ur: NEGATIVE
Leukocytes, UA: NEGATIVE
Nitrite: NEGATIVE
Total Protein, Urine: NEGATIVE
Urine Glucose: NEGATIVE
Urobilinogen, UA: 0.2 (ref 0.0–1.0)
pH: 7 (ref 5.0–8.0)

## 2015-10-12 LAB — PSA: PSA: 0.97 ng/mL (ref 0.10–4.00)

## 2015-10-12 LAB — TSH: TSH: 1.2 u[IU]/mL (ref 0.35–4.50)

## 2015-10-12 MED ORDER — ASPIRIN EC 81 MG PO TBEC: 81.0000 mg | DELAYED_RELEASE_TABLET | Freq: Every day | ORAL | Status: DC

## 2015-10-12 MED ORDER — EFINACONAZOLE 10 % EX SOLN: CUTANEOUS | Status: DC

## 2015-10-12 MED ORDER — ATORVASTATIN CALCIUM 20 MG PO TABS: 20.0000 mg | ORAL_TABLET | Freq: Every day | ORAL | Status: DC

## 2015-10-12 NOTE — Progress Notes (Signed)
Subjective:    Patient ID: Richard Morrow, male    DOB: 03-Sep-1961, 55 y.o.   MRN: [id number]  HPI   Here for wellness and f/u;  Overall doing ok;  Pt denies Chest pain, worsening SOB, DOE, wheezing, orthopnea, PND, worsening LE edema, palpitations, dizziness or syncope.  Pt denies neurological change such as new headache, facial or extremity weakness.  Pt denies polydipsia, polyuria, or low sugar symptoms. Pt states overall good compliance with treatment and medications, good tolerability, and has been trying to follow appropriate diet.  Pt denies worsening depressive symptoms, suicidal ideation or panic. No fever, night sweats, wt loss, loss of appetite, or other constitutional symptoms.  Pt states good ability with ADL's, has low fall risk, home safety reviewed and adequate, no other significant changes in hearing or vision, and fairly active with exercise several times per wk, helps with stress. Taking benadryl prn rash, hive like he thinks to torso and legs. Asks for Jublia for right great toe onychomycosis.  More stressors work related due to over responsbility.   Past Medical History  Diagnosis Date  . HYPERLIPIDEMIA 05/18/2007    Qualifier: Diagnosis of  By: Dance CMA ([place]), Kim    . ALLERGIC RHINITIS 05/18/2007    Qualifier: Diagnosis of  By: Dance CMA (AAMA), Kim     Past Surgical History  Procedure Laterality Date  . Rotater cuff repair      left  . Colonoscopy      reports that he has never smoked. He has never used smokeless tobacco. He reports that he drinks alcohol. He reports that he does not use illicit drugs. family history includes Allergic rhinitis in his sister; Colon cancer (age of onset: 83) in his mother. There is no history of Esophageal cancer, Rectal cancer, or Stomach cancer. No Known Allergies No current outpatient prescriptions on file prior to visit.   No current facility-administered medications on file prior to visit.   Review of Systems Constitutional:  Negative for increased diaphoresis, other activity, appetite or siginficant weight change other than noted HENT: Negative for worsening hearing loss, ear pain, facial swelling, mouth sores and neck stiffness.   Eyes: Negative for other worsening pain, redness or visual disturbance.  Respiratory: Negative for shortness of breath and wheezing  Cardiovascular: Negative for chest pain and palpitations.  Gastrointestinal: Negative for diarrhea, blood in stool, abdominal distention or other pain Genitourinary: Negative for hematuria, flank pain or change in urine volume.  Musculoskeletal: Negative for myalgias or other joint complaints.  Skin: Negative for color change and wound or drainage.  Neurological: Negative for syncope and numbness. other than noted Hematological: Negative for adenopathy. or other swelling Psychiatric/Behavioral: Negative for hallucinations, SI, self-injury, decreased concentration or other worsening agitation.      Objective:   Physical Exam BP 110/78 mmHg  Pulse 78  Temp(Src) 98.6 F (37 C) (Oral)  Ht 5\' 9"  (1.753 m)  Wt 171 lb (77.565 kg)  BMI 25.24 kg/m2  SpO2 96% VS noted,  Constitutional: Pt is oriented to person, place, and time. Appears well-developed and well-nourished, in no significant distress Head: Normocephalic and atraumatic.  Right Ear: External ear normal.  Left Ear: External ear normal.  Nose: Nose normal.  Mouth/Throat: Oropharynx is clear and moist.  Eyes: Conjunctivae and EOM are normal. Pupils are equal, round, and reactive to light.  Neck: Normal range of motion. Neck supple. No JVD present. No tracheal deviation present or significant neck LA or mass Cardiovascular: Normal rate, regular  rhythm, normal heart sounds and intact distal pulses.   Pulmonary/Chest: Effort normal and breath sounds without rales or wheezing  Abdominal: Soft. Bowel sounds are normal. NT. No HSM  Musculoskeletal: Normal range of motion. Exhibits no edema.    Lymphadenopathy:  Has no cervical adenopathy.  Neurological: Pt is alert and oriented to person, place, and time. Pt has normal reflexes. No cranial nerve deficit. Motor grossly intact Skin: Skin is warm and dry. No rash noted. several small torso dark lesions noted, none black, no hives currently Psychiatric:  Has mild stressed mood and affect. Behavior is normal.   ECG reviewed as per emr    Assessment & Plan:

## 2015-10-12 NOTE — Patient Instructions (Addendum)
Please take all new medication as prescribed - the Jublia  Please also start taking Aspirin 81 mg - 1 per day - Coated only to reduce risk of future heart disease and stroke  Please continue all other medications as before, and refills have been done if requested.  Please have the pharmacy call with any other refills you may need.  Please continue your efforts at being more active, low cholesterol diet, and weight control.  You are otherwise up to date with prevention measures today.  Please keep your appointments with your specialists as you may have planned  You will be contacted regarding the referral for:  dermaology  Please go to the LAB in the Basement (turn left off the elevator) for the tests to be done today  You will be contacted by phone if any changes need to be made immediately.  Otherwise, you will receive a letter about your results with an explanation, but please check with MyChart first.  Please remember to sign up for MyChart if you have not done so, as this will be important to you in the future with finding out test results, communicating by private email, and scheduling acute appointments online when needed.  Please return in 1 year for your yearly visit, or sooner if needed, with Lab testing done 3-5 days before

## 2015-10-12 NOTE — Progress Notes (Signed)
Pre visit review using our clinic review tool, if applicable. No additional management support is needed unless otherwise documented below in the visit note. 

## 2015-10-13 ENCOUNTER — Encounter: Payer: Self-pay | Admitting: Internal Medicine

## 2015-10-13 NOTE — Assessment & Plan Note (Signed)

## 2015-10-13 NOTE — Assessment & Plan Note (Signed)
Also for derm referral, to f/u any worsening symptoms or concerns

## 2015-10-13 NOTE — Assessment & Plan Note (Signed)
Recurrent, for benadryl otc prn,  to f/u any worsening symptoms or concerns, declines allergy referral

## 2015-10-13 NOTE — Assessment & Plan Note (Signed)
Ok for [place] per pt request,  to f/u any worsening symptoms or concerns

## 2015-10-15 ENCOUNTER — Telehealth: Payer: Self-pay

## 2015-10-15 NOTE — Telephone Encounter (Signed)
PA request from [place], [place]  Message to provider: is it okay to dispense a generic alternative.   If not, I will continue with PA as is.

## 2015-10-16 NOTE — Telephone Encounter (Signed)
Pt asked specifically for this, so OK to try, but I suspect it may be denied

## 2015-10-17 NOTE — Telephone Encounter (Signed)
PA started via cover my meds. Clinical data submitted.   KEY: [id number]

## 2015-10-22 NOTE — Telephone Encounter (Signed)
PA for Jublia was denied. Pt must first try Ciclodan 8% (6.6 ml bottle), Ciclopirox topical 8%, or Ciclopirox 8% treatment kit. Please advise on alternative medication

## 2015-10-23 ENCOUNTER — Telehealth: Payer: Self-pay

## 2015-10-23 MED ORDER — CICLOPIROX 8 % EX SOLN: Freq: Every day | CUTANEOUS | Status: DC

## 2015-10-23 NOTE — Telephone Encounter (Signed)
Called patient to let him know that the prescription for ciclopirox has been sent in to his pharmacy.

## 2015-10-23 NOTE — Telephone Encounter (Signed)
[place] for the ciclopirox 8% - done erx  Please inform pt

## 2016-02-01 DIAGNOSIS — Z79899 Other long term (current) drug therapy: Secondary | ICD-10-CM | POA: Diagnosis not present

## 2016-04-09 DIAGNOSIS — Z79899 Other long term (current) drug therapy: Secondary | ICD-10-CM | POA: Diagnosis not present

## 2016-06-26 DIAGNOSIS — L503 Dermatographic urticaria: Secondary | ICD-10-CM | POA: Diagnosis not present

## 2016-08-13 DIAGNOSIS — L2089 Other atopic dermatitis: Secondary | ICD-10-CM | POA: Diagnosis not present

## 2016-08-13 DIAGNOSIS — L299 Pruritus, unspecified: Secondary | ICD-10-CM | POA: Diagnosis not present

## 2016-09-09 DIAGNOSIS — M9902 Segmental and somatic dysfunction of thoracic region: Secondary | ICD-10-CM | POA: Diagnosis not present

## 2016-09-09 DIAGNOSIS — M5414 Radiculopathy, thoracic region: Secondary | ICD-10-CM | POA: Diagnosis not present

## 2016-09-09 DIAGNOSIS — M9901 Segmental and somatic dysfunction of cervical region: Secondary | ICD-10-CM | POA: Diagnosis not present

## 2016-09-09 DIAGNOSIS — M791 Myalgia: Secondary | ICD-10-CM | POA: Diagnosis not present

## 2016-09-12 DIAGNOSIS — M9901 Segmental and somatic dysfunction of cervical region: Secondary | ICD-10-CM | POA: Diagnosis not present

## 2016-09-12 DIAGNOSIS — M791 Myalgia: Secondary | ICD-10-CM | POA: Diagnosis not present

## 2016-09-12 DIAGNOSIS — M5414 Radiculopathy, thoracic region: Secondary | ICD-10-CM | POA: Diagnosis not present

## 2016-09-12 DIAGNOSIS — M9902 Segmental and somatic dysfunction of thoracic region: Secondary | ICD-10-CM | POA: Diagnosis not present

## 2016-09-19 DIAGNOSIS — M791 Myalgia: Secondary | ICD-10-CM | POA: Diagnosis not present

## 2016-09-19 DIAGNOSIS — M9902 Segmental and somatic dysfunction of thoracic region: Secondary | ICD-10-CM | POA: Diagnosis not present

## 2016-09-19 DIAGNOSIS — M9901 Segmental and somatic dysfunction of cervical region: Secondary | ICD-10-CM | POA: Diagnosis not present

## 2016-09-19 DIAGNOSIS — M5414 Radiculopathy, thoracic region: Secondary | ICD-10-CM | POA: Diagnosis not present

## 2016-09-24 DIAGNOSIS — L2089 Other atopic dermatitis: Secondary | ICD-10-CM | POA: Diagnosis not present

## 2016-09-24 DIAGNOSIS — L820 Inflamed seborrheic keratosis: Secondary | ICD-10-CM | POA: Diagnosis not present

## 2016-09-26 DIAGNOSIS — M9902 Segmental and somatic dysfunction of thoracic region: Secondary | ICD-10-CM | POA: Diagnosis not present

## 2016-09-26 DIAGNOSIS — M791 Myalgia: Secondary | ICD-10-CM | POA: Diagnosis not present

## 2016-09-26 DIAGNOSIS — M5414 Radiculopathy, thoracic region: Secondary | ICD-10-CM | POA: Diagnosis not present

## 2016-09-26 DIAGNOSIS — M9901 Segmental and somatic dysfunction of cervical region: Secondary | ICD-10-CM | POA: Diagnosis not present

## 2016-10-02 ENCOUNTER — Telehealth: Payer: Self-pay | Admitting: Internal Medicine

## 2016-10-02 NOTE — Telephone Encounter (Signed)
Patient wants to make sure labs are good for him to come in before next CPE.

## 2016-10-02 NOTE — Telephone Encounter (Signed)
Labs are already ordered

## 2016-10-07 DIAGNOSIS — M5414 Radiculopathy, thoracic region: Secondary | ICD-10-CM | POA: Diagnosis not present

## 2016-10-07 DIAGNOSIS — M9901 Segmental and somatic dysfunction of cervical region: Secondary | ICD-10-CM | POA: Diagnosis not present

## 2016-10-07 DIAGNOSIS — M791 Myalgia: Secondary | ICD-10-CM | POA: Diagnosis not present

## 2016-10-07 DIAGNOSIS — M9902 Segmental and somatic dysfunction of thoracic region: Secondary | ICD-10-CM | POA: Diagnosis not present

## 2016-10-17 ENCOUNTER — Other Ambulatory Visit (INDEPENDENT_AMBULATORY_CARE_PROVIDER_SITE_OTHER): Payer: BLUE CROSS/BLUE SHIELD

## 2016-10-17 ENCOUNTER — Telehealth: Payer: Self-pay

## 2016-10-17 ENCOUNTER — Ambulatory Visit (INDEPENDENT_AMBULATORY_CARE_PROVIDER_SITE_OTHER): Payer: BLUE CROSS/BLUE SHIELD | Admitting: Internal Medicine

## 2016-10-17 ENCOUNTER — Encounter: Payer: Self-pay | Admitting: Internal Medicine

## 2016-10-17 VITALS — BP 126/80 | HR 70 | Temp 98.6°F | Resp 20 | Wt 170.0 lb

## 2016-10-17 DIAGNOSIS — Z1159 Encounter for screening for other viral diseases: Secondary | ICD-10-CM

## 2016-10-17 DIAGNOSIS — E785 Hyperlipidemia, unspecified: Secondary | ICD-10-CM | POA: Diagnosis not present

## 2016-10-17 DIAGNOSIS — G5692 Unspecified mononeuropathy of left upper limb: Secondary | ICD-10-CM | POA: Diagnosis not present

## 2016-10-17 DIAGNOSIS — Z0001 Encounter for general adult medical examination with abnormal findings: Secondary | ICD-10-CM

## 2016-10-17 DIAGNOSIS — Z860101 Personal history of adenomatous and serrated colon polyps: Secondary | ICD-10-CM | POA: Insufficient documentation

## 2016-10-17 DIAGNOSIS — Z86010 Personal history of colonic polyps: Secondary | ICD-10-CM | POA: Diagnosis not present

## 2016-10-17 DIAGNOSIS — F411 Generalized anxiety disorder: Secondary | ICD-10-CM | POA: Diagnosis not present

## 2016-10-17 LAB — BASIC METABOLIC PANEL
BUN: 10 mg/dL (ref 6–23)
CALCIUM: 9.6 mg/dL (ref 8.4–10.5)
CHLORIDE: 98 meq/L (ref 96–112)
CO2: 31 meq/L (ref 19–32)
Creatinine, Ser: 0.77 mg/dL (ref 0.40–1.50)
GFR: 111.85 mL/min (ref >60.00)
Glucose, Bld: 102 mg/dL — ABNORMAL HIGH (ref 70–99)
Potassium: 4.4 mEq/L (ref 3.5–5.1)
SODIUM: 135 meq/L (ref 135–145)

## 2016-10-17 LAB — URINALYSIS, ROUTINE W REFLEX MICROSCOPIC
Bilirubin Urine: NEGATIVE
Hgb urine dipstick: NEGATIVE
Ketones, ur: NEGATIVE
Leukocytes, UA: NEGATIVE
Nitrite: NEGATIVE
PH: 7.5 (ref 5.0–8.0)
RBC / HPF: NONE SEEN (ref 0–?)
SPECIFIC GRAVITY, URINE: 1.01 (ref 1.000–1.030)
Total Protein, Urine: NEGATIVE
UROBILINOGEN UA: 0.2 (ref 0.0–1.0)
Urine Glucose: NEGATIVE
WBC, UA: NONE SEEN (ref 0–?)

## 2016-10-17 LAB — LIPID PANEL
Cholesterol: 182 mg/dL (ref 0–200)
HDL: 58 mg/dL (ref >39.00)
LDL Cholesterol: 111 mg/dL — ABNORMAL HIGH (ref 0–99)
NONHDL: 124.04
Total CHOL/HDL Ratio: 3
Triglycerides: 67 mg/dL (ref 0.0–149.0)
VLDL: 13.4 mg/dL (ref 0.0–40.0)

## 2016-10-17 LAB — HEPATIC FUNCTION PANEL
ALK PHOS: 46 U/L (ref 39–117)
ALT: 15 U/L (ref 0–53)
AST: 22 U/L (ref 0–37)
Albumin: 4.7 g/dL (ref 3.5–5.2)
BILIRUBIN DIRECT: 0.3 mg/dL (ref 0.0–0.3)
Total Bilirubin: 1.6 mg/dL — ABNORMAL HIGH (ref 0.2–1.2)
Total Protein: 7.3 g/dL (ref 6.0–8.3)

## 2016-10-17 LAB — CBC WITH DIFFERENTIAL/PLATELET
BASOS ABS: 0 10*3/uL (ref 0.0–0.1)
Basophils Relative: 0.5 % (ref 0.0–3.0)
Eosinophils Absolute: 0.2 10*3/uL (ref 0.0–0.7)
Eosinophils Relative: 2.4 % (ref 0.0–5.0)
HCT: 44.8 % (ref 39.0–52.0)
Hemoglobin: 15.5 g/dL (ref 13.0–17.0)
LYMPHS ABS: 2.1 10*3/uL (ref 0.7–4.0)
Lymphocytes Relative: 29.6 % (ref 12.0–46.0)
MCHC: 34.7 g/dL (ref 30.0–36.0)
MCV: 89.9 fl (ref 78.0–100.0)
MONOS PCT: 8 % (ref 3.0–12.0)
Monocytes Absolute: 0.6 10*3/uL (ref 0.1–1.0)
NEUTROS ABS: 4.1 10*3/uL (ref 1.4–7.7)
NEUTROS PCT: 59.5 % (ref 43.0–77.0)
PLATELETS: 305 10*3/uL (ref 150.0–400.0)
RBC: 4.99 Mil/uL (ref 4.22–5.81)
RDW: 13.3 % (ref 11.5–15.5)
WBC: 6.9 10*3/uL (ref 4.0–10.5)

## 2016-10-17 LAB — HEPATITIS C ANTIBODY: HCV AB: NEGATIVE

## 2016-10-17 LAB — TSH: TSH: 1.05 u[IU]/mL (ref 0.35–4.50)

## 2016-10-17 LAB — PSA: PSA: 0.88 ng/mL (ref 0.10–4.00)

## 2016-10-17 MED ORDER — ALPRAZOLAM 0.25 MG PO TABS: 0.2500 mg | ORAL_TABLET | Freq: Every evening | ORAL | 0 refills | Status: DC | PRN

## 2016-10-17 MED ORDER — ESCITALOPRAM OXALATE 10 MG PO TABS: 10.0000 mg | ORAL_TABLET | Freq: Every day | ORAL | 3 refills | Status: DC

## 2016-10-17 NOTE — Progress Notes (Signed)
Subjective:    Patient ID: Merleen Milliner, male    DOB: 04-03-1961, 56 y.o.   MRN: [id number]  HPI    Here for wellness and f/u;  Overall doing ok;  Pt denies Chest pain, worsening SOB, DOE, wheezing, orthopnea, PND, worsening LE edema, palpitations, dizziness or syncope.  Pt denies neurological change such as new headache, facial or extremity weakness.  Pt denies polydipsia, polyuria, or low sugar symptoms. Pt states overall good compliance with treatment and medications, good tolerability, and has been trying to follow appropriate diet.  No fever, night sweats, wt loss, loss of appetite, or other constitutional symptoms.  Pt states good ability with ADL's, has low fall risk, home safety reviewed and adequate, no other significant changes in hearing or vision, and less active with exercise in the past year. BP Readings from Last 3 Encounters:  10/17/16 126/80  10/12/15 110/78  10/05/14 122/82   Wt Readings from Last 3 Encounters:  10/17/16 170 lb (77.1 kg)  10/12/15 171 lb (77.6 kg)  10/05/14 172 lb 2 oz (78.1 kg)  Planning on semi-retirement as accountant, much more work related stress, and insomnia.    Does also c/o left arm neuritic type tingling burning seems worse at the left wrist but also some at the elbow and slightly above as well, denies neck pain but has been 2-3 mo persistent, and now with decreased left grip, nothing else makes better or worse. Also c/o worsening anxiety and stress, less exercise in the past yr that has been able to control it,  Pt denies worsening depressive symptoms, suicidal ideation or panic  No other new history Past Medical History:  Diagnosis Date  . ALLERGIC RHINITIS 05/18/2007   Qualifier: Diagnosis of  By: Dance CMA ([place]), Kim    . HYPERLIPIDEMIA 05/18/2007   Qualifier: Diagnosis of  By: Dance CMA (AAMA), Kim     Past Surgical History:  Procedure Laterality Date  . COLONOSCOPY    . rotater cuff repair     left    reports that he has never  smoked. He has never used smokeless tobacco. He reports that he drinks alcohol. He reports that he does not use drugs. family history includes Allergic rhinitis in his sister; Colon cancer (age of onset: 4) in his mother. No Known Allergies Current Outpatient Prescriptions on File Prior to Visit  Medication Sig Dispense Refill  . atorvastatin (LIPITOR) 20 MG tablet Take 1 tablet (20 mg total) by mouth daily. 90 tablet 3  . aspirin EC 81 MG tablet Take 1 tablet (81 mg total) by mouth daily. (Patient not taking: Reported on 10/17/2016) 90 tablet 11  . ciclopirox (PENLAC) 8 % solution Apply topically at bedtime. Apply over nail and surrounding skin. Apply daily over previous coat. After seven (7) days, may remove with alcohol and continue cycle. (Patient not taking: Reported on 10/17/2016) 6.6 mL 2  . clobetasol cream (TEMOVATE) [id number] % Apply 1 application topically daily.    . clotrimazole-betamethasone (LOTRISONE) cream Apply 1 application topically as needed.    . desonide (DESOWEN) 0.05 % cream Apply 1 application topically daily.    . Efinaconazole (JUBLIA) 10 % SOLN Use as directed daily to affected area (Patient not taking: Reported on 10/17/2016) 8 mL 11   No current facility-administered medications on file prior to visit.    Review of Systems Constitutional: Negative for increased diaphoresis, or other activity, appetite or siginficant weight change other than noted HENT: Negative for worsening hearing loss, ear  pain, facial swelling, mouth sores and neck stiffness.   Eyes: Negative for other worsening pain, redness or visual disturbance.  Respiratory: Negative for choking or stridor Cardiovascular: Negative for other chest pain and palpitations.  Gastrointestinal: Negative for worsening diarrhea, blood in stool, or abdominal distention Genitourinary: Negative for hematuria, flank pain or change in urine volume.  Musculoskeletal: Negative for myalgias or other joint complaints.  Skin:  Negative for other color change and wound or drainage.  Neurological: Negative for syncope and numbness. other than noted Hematological: Negative for adenopathy. or other swelling Psychiatric/Behavioral: Negative for hallucinations, SI, self-injury, decreased concentration or other worsening agitation.  All other system neg per pt    Objective:   Physical Exam BP 126/80   Pulse 70   Temp 98.6 F (37 C) (Oral)   Resp 20   Wt 170 lb (77.1 kg)   SpO2 98%   BMI 25.10 kg/m  VS noted,  Constitutional: Pt is oriented to person, place, and time. Appears well-developed and well-nourished, in no significant distress Head: Normocephalic and atraumatic  Eyes: Conjunctivae and EOM are normal. Pupils are equal, round, and reactive to light Right Ear: External ear normal.  Left Ear: External ear normal Nose: Nose normal.  Mouth/Throat: Oropharynx is clear and moist  Neck: Normal range of motion. Neck supple. No JVD present. No tracheal deviation present or significant neck LA or mass Cardiovascular: Normal rate, regular rhythm, normal heart sounds and intact distal pulses.   Pulmonary/Chest: Effort normal and breath sounds without rales or wheezing  Abdominal: Soft. Bowel sounds are normal. NT. No HSM  Spine nontender midline Musculoskeletal: Normal range of motion. Exhibits no edema Lymphadenopathy: Has no cervical adenopathy.  Neurological: Pt is alert and oriented to person, place, and time. Pt has normal reflexes. No cranial nerve deficit. Motor grossly intact except for decreased distal LUE motor and reduced grip strength to 4+ Skin: Skin is warm and dry. No rash noted or new ulcers Psychiatric:  Has nervous mood and affect. Behavior is normal. No other new exam findings    Assessment & Plan:

## 2016-10-17 NOTE — Patient Instructions (Addendum)
Please take all new medication as prescribed  - the lexapro 10 mg per day  Please take all new medication as prescribed - the low dose xanax at night for a short time  Please wear the left wrist splint at night for at least one week, or longer if needed  Please continue all other medications as before, and refills have been done if requested.  Please have the pharmacy call with any other refills you may need.  Please continue your efforts at being more active, low cholesterol diet, and weight control.  You are otherwise up to date with prevention measures today.  Please keep your appointments with your specialists as you may have planned  You will be contacted regarding the referral for: colonoscopy for about June 2018, and the Nerve Testing   Please go to the LAB in the Basement (turn left off the elevator) for the tests to be done today  You will be contacted by phone if any changes need to be made immediately.  Otherwise, you will receive a letter about your results with an explanation, but please check with MyChart first.  Please remember to sign up for MyChart if you have not done so, as this will be important to you in the future with finding out test results, communicating by private email, and scheduling acute appointments online when needed.  Please return in 1 year for your yearly visit, or sooner if needed, with Lab testing done 3-5 days before

## 2016-10-17 NOTE — Telephone Encounter (Signed)
Patient came in for office visit , cpe with dr Jenny Reichmann today, went down to lab for blood work and passed out after blood was drawn----bp 128/72, hr50--per dr Jenny Reichmann patient is having a vagul response and needs to continue to lie down for appx 1 hour---patient has vomited also---patient was assisted from floor to recliner and advised to lie flat for appx 1 hour, if not totally better, without dizziness at that time, he would need to call someone to pick him up---patient stated he is feeling better but a little "whoozy"--patient again reminded to lay as flat as possible for about 1 hour and then recheck how he feels, lab personnel advised to call tamara back down if anything different happens

## 2016-10-17 NOTE — Progress Notes (Signed)
Pre visit review using our clinic review tool, if applicable. No additional management support is needed unless otherwise documented below in the visit note. 

## 2016-10-19 ENCOUNTER — Other Ambulatory Visit: Payer: Self-pay | Admitting: Internal Medicine

## 2016-10-19 DIAGNOSIS — G5692 Unspecified mononeuropathy of left upper limb: Secondary | ICD-10-CM | POA: Insufficient documentation

## 2016-10-19 NOTE — Assessment & Plan Note (Addendum)
New onset, possible CTS vs ulnar neuritis by exam, for NCS/ EMG, left wrist splint at night  In addition to the time spent performing CPE, I spent an additional 25 minutes face to face,in which greater than 50% of this time was spent in counseling and coordination of care for patient's acute illness as documented.

## 2016-10-19 NOTE — Assessment & Plan Note (Addendum)
Lab Results  Component Value Date   LDLCALC 111 (H) 10/17/2016   Mild worsening, for lower chol diet, increase lipitor 20 qd

## 2016-10-19 NOTE — Assessment & Plan Note (Signed)
For colonoscopy as is due 

## 2016-10-19 NOTE — Assessment & Plan Note (Signed)

## 2016-10-19 NOTE — Assessment & Plan Note (Signed)
Mild to mod, for lexapro asd, declines counseling,  to f/u any worsening symptoms or concerns

## 2016-10-21 ENCOUNTER — Other Ambulatory Visit: Payer: Self-pay | Admitting: *Deleted

## 2016-10-21 DIAGNOSIS — G629 Polyneuropathy, unspecified: Secondary | ICD-10-CM

## 2016-11-06 ENCOUNTER — Ambulatory Visit (INDEPENDENT_AMBULATORY_CARE_PROVIDER_SITE_OTHER): Payer: BLUE CROSS/BLUE SHIELD | Admitting: Neurology

## 2016-11-06 DIAGNOSIS — G629 Polyneuropathy, unspecified: Secondary | ICD-10-CM | POA: Diagnosis not present

## 2016-11-06 DIAGNOSIS — M5412 Radiculopathy, cervical region: Secondary | ICD-10-CM

## 2016-11-06 NOTE — Procedures (Signed)
[facility]  [address]  [place], [place] [address] Tel: [phone] Fax:  [phone] Test Date:  11/06/2016  Patient: Richard Morrow DOB: 12-13-1960 Physician: Narda Amber, DO  Sex: Male Height: 5\' 9"  Ref Phys: Cathlean Cower, M.D.  ID#: [id number] Temp: 32.5C Technician: Jerilynn Mages. Dean   Patient Complaints: This is a 56 year old gentleman referred for evaluation of left forearm numbness and pain radiating from his neck.   NCV & EMG Findings: Extensive electrodiagnostic testing of the left upper extremity and additional studies of the right shows:  1. Bilateral median, ulnar, mixed palmar, and the left dorsal ulnar cutaneous sensory responses are within normal limits. 2. Bilateral median and ulnar motor responses are within normal limits. 3. Sparse chronic motor axon loss changes are seen affecting the infraspinatus and deltoid muscles on the left. There is no evidence of accompanied active denervation.  Impression: 1. Chronic C5 radiculopathy affecting the left upper extremity, very mild in degree electrically. 2. There is no evidence of carpal tunnel syndrome or ulnar neuropathy affecting the upper extremities.   ___________________________ Narda Amber, DO    Nerve Conduction Studies Anti Sensory Summary Table   Site NR Peak (ms) Norm Peak (ms) P-T Amp (V) Norm P-T Amp  Left DorsCutan Anti Sensory (Dorsum 5th MC)  32.5C  Wrist    2.4 <3.1 17.1 >10  Left Median Anti Sensory (2nd Digit)  32.5C  Wrist    3.0 <3.6 29.8 >15  Right Median Anti Sensory (2nd Digit)  32.5C  Wrist    3.1 <3.6 30.3 >15  Left Ulnar Anti Sensory (5th Digit)  32.5C  Wrist    2.9 <3.1 32.5 >10  Right Ulnar Anti Sensory (5th Digit)  32.5C  Wrist    2.9 <3.1 27.6 >10   Motor Summary Table   Site NR Onset (ms) Norm Onset (ms) O-P Amp (mV) Norm O-P Amp Site1 Site2 Delta-0 (ms) Dist (cm) Vel (m/s) Norm Vel (m/s)  Left Median Motor (Abd Poll Brev)  32.5C  Wrist    3.2 <4.0  9.4 >6 Elbow Wrist 4.1 23.0 56 >50  Elbow    7.3  9.3         Right Median Motor (Abd Poll Brev)  32.5C  Wrist    3.0 <4.0 9.8 >6 Elbow Wrist 4.3 24.0 56 >50  Elbow    7.3  9.4         Left Ulnar Motor (Abd Dig Minimi)  32.5C  Wrist    3.0 <3.1 10.3 >7 B Elbow Wrist 3.2 19.0 59 >50  B Elbow    6.2  10.2  A Elbow B Elbow 1.5 10.0 67 >50  A Elbow    7.7  10.1         Right Ulnar Motor (Abd Dig Minimi)  32.5C  Wrist    2.7 <3.1 10.0 >7 B Elbow Wrist 3.6 20.0 56 >50  B Elbow    6.3  9.2  A Elbow B Elbow 1.6 10.0 62 >50  A Elbow    7.9  8.9          Comparison Summary Table   Site NR Peak (ms) Norm Peak (ms) P-T Amp (V) Site1 Site2 Delta-P (ms) Norm Delta (ms)  Left Median/Ulnar Palm Comparison (Wrist - 8cm)  32.5C  Median Palm    1.8 <2.2 109.8 Median Palm Ulnar Palm 0.0   Ulnar Palm    1.8 <2.2 20.4      Right Median/Ulnar Palm  Comparison (Wrist - 8cm)  32.5C  Median Palm    1.8 <2.2 117.8 Median Palm Ulnar Palm 0.2   Ulnar Palm    2.0 <2.2 29.4       EMG   Side Muscle Ins Act Fibs Psw Fasc Number Recrt Dur Dur. Amp Amp. Poly Poly. Comment  Left 1stDorInt Nml Nml Nml Nml Nml Nml Nml Nml Nml Nml Nml Nml N/A  Left Ext Indicis Nml Nml Nml Nml Nml Nml Nml Nml Nml Nml Nml Nml N/A  Left PronatorTeres Nml Nml Nml Nml Nml Nml Nml Nml Nml Nml Nml Nml N/A  Left Biceps Nml Nml Nml Nml Nml Nml Nml Nml Nml Nml Nml Nml N/A  Left Triceps Nml Nml Nml Nml Nml Nml Nml Nml Nml Nml Nml Nml N/A  Left Deltoid Nml Nml Nml Nml 1- Rapid Few 1+ Few 1+ Nml Nml N/A  Left Infraspinatus Nml Nml Nml Nml 1- Rapid Few 1+ Few 1+ Nml Nml N/A      Waveforms:

## 2016-11-12 ENCOUNTER — Other Ambulatory Visit: Payer: Self-pay | Admitting: Internal Medicine

## 2016-11-12 DIAGNOSIS — M5412 Radiculopathy, cervical region: Secondary | ICD-10-CM

## 2016-11-25 ENCOUNTER — Ambulatory Visit
Admission: RE | Admit: 2016-11-25 | Discharge: 2016-11-25 | Disposition: A | Discharge status: Home / Self Care | Payer: BLUE CROSS/BLUE SHIELD | Source: Ambulatory Visit | Attending: Internal Medicine | Admitting: Internal Medicine

## 2016-11-25 DIAGNOSIS — M5412 Radiculopathy, cervical region: Secondary | ICD-10-CM

## 2016-11-25 DIAGNOSIS — M50221 Other cervical disc displacement at C4-C5 level: Secondary | ICD-10-CM | POA: Diagnosis not present

## 2016-11-25 DIAGNOSIS — M50222 Other cervical disc displacement at C5-C6 level: Secondary | ICD-10-CM | POA: Diagnosis not present

## 2016-11-27 ENCOUNTER — Telehealth: Payer: Self-pay | Admitting: Internal Medicine

## 2016-11-27 NOTE — Telephone Encounter (Signed)
Patient is requesting call back in regard to Dr. Gwynn Burly response through [facility] on MRI.  Patient states he is not clear on what Dr. Jenny Reichmann was telling him.

## 2016-11-27 NOTE — Telephone Encounter (Signed)
Im not sure what else to say except the MRI shows several areas of possible nerve pinching, including the left side where he was having the arm symptoms  A more detailed discussion is often best with the surgeon who give more details on any symptomatic area of the neck that may have to be dealt with surgically

## 2016-11-27 NOTE — Telephone Encounter (Signed)
Can you please give more detailed information----I will call patient back

## 2016-11-28 ENCOUNTER — Encounter: Payer: Self-pay | Admitting: *Deleted

## 2016-11-28 ENCOUNTER — Telehealth: Payer: Self-pay | Admitting: Neurology

## 2016-11-28 NOTE — Telephone Encounter (Signed)
Sent patient a message via My Chart instructing him to call Dr. Gwynn Burly office.

## 2016-11-28 NOTE — Telephone Encounter (Signed)
Richard Morrow [birth year]. His # [phone]. He has already received his MRI results but needs to know what to do at this point? Thank you

## 2016-11-28 NOTE — Telephone Encounter (Signed)
Advised patient of dr Gwynn Burly note---patient will reach back out to neurosx to see what tx is offered at this point, patient continues to have symptoms

## 2016-12-01 ENCOUNTER — Encounter: Payer: Self-pay | Admitting: Internal Medicine

## 2016-12-02 NOTE — Telephone Encounter (Signed)
am not sure which office Mr roback is calling as there is no identification of where the staff is contacting me from that I can tell.  Ads the EMR indicates, pt has been referred to Neurosurgury and should have been notified of this.  I will forward this concern to our Kahi Mohala who helps with the referral, thanks  PCCs to contact pt to address his referral status, thanks

## 2016-12-02 NOTE — Telephone Encounter (Signed)
PCC;s to help pt please

## 2016-12-24 DIAGNOSIS — Z6825 Body mass index (BMI) 25.0-25.9, adult: Secondary | ICD-10-CM | POA: Diagnosis not present

## 2016-12-24 DIAGNOSIS — M5020 Other cervical disc displacement, unspecified cervical region: Secondary | ICD-10-CM | POA: Diagnosis not present

## 2016-12-24 DIAGNOSIS — R030 Elevated blood-pressure reading, without diagnosis of hypertension: Secondary | ICD-10-CM | POA: Diagnosis not present

## 2016-12-24 DIAGNOSIS — M5412 Radiculopathy, cervical region: Secondary | ICD-10-CM | POA: Diagnosis not present

## 2017-01-06 ENCOUNTER — Encounter: Payer: Self-pay | Admitting: Gastroenterology

## 2017-01-28 ENCOUNTER — Encounter: Payer: Self-pay | Admitting: Internal Medicine

## 2017-04-08 ENCOUNTER — Encounter: Payer: Self-pay | Admitting: Gastroenterology

## 2017-05-28 ENCOUNTER — Ambulatory Visit (AMBULATORY_SURGERY_CENTER): Payer: Self-pay | Admitting: *Deleted

## 2017-05-28 VITALS — Ht 69.0 in | Wt 172.8 lb

## 2017-05-28 DIAGNOSIS — Z800 Family history of malignant neoplasm of digestive organs: Secondary | ICD-10-CM

## 2017-05-28 DIAGNOSIS — Z86010 Personal history of colonic polyps: Secondary | ICD-10-CM

## 2017-05-28 MED ORDER — NA SULFATE-K SULFATE-MG SULF 17.5-3.13-1.6 GM/177ML PO SOLN: 1.0000 | Freq: Once | ORAL | 0 refills | Status: AC

## 2017-05-28 NOTE — Progress Notes (Signed)
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

## 2017-06-11 ENCOUNTER — Encounter: Payer: Self-pay | Admitting: Gastroenterology

## 2017-06-11 ENCOUNTER — Ambulatory Visit (AMBULATORY_SURGERY_CENTER): Payer: BLUE CROSS/BLUE SHIELD | Admitting: Gastroenterology

## 2017-06-11 VITALS — BP 113/71 | HR 61 | Temp 98.9°F | Resp 13 | Ht 69.0 in | Wt 172.0 lb

## 2017-06-11 DIAGNOSIS — K621 Rectal polyp: Secondary | ICD-10-CM | POA: Diagnosis not present

## 2017-06-11 DIAGNOSIS — D122 Benign neoplasm of ascending colon: Secondary | ICD-10-CM | POA: Diagnosis not present

## 2017-06-11 DIAGNOSIS — D128 Benign neoplasm of rectum: Secondary | ICD-10-CM

## 2017-06-11 DIAGNOSIS — Z86010 Personal history of colonic polyps: Secondary | ICD-10-CM | POA: Diagnosis present

## 2017-06-11 DIAGNOSIS — D129 Benign neoplasm of anus and anal canal: Secondary | ICD-10-CM

## 2017-06-11 MED ORDER — SODIUM CHLORIDE 0.9 % IV SOLN: 500.0000 mL | INTRAVENOUS | Status: DC

## 2017-06-11 NOTE — Progress Notes (Signed)
To PACU, VSS. Report to [email or web address] 

## 2017-06-11 NOTE — Patient Instructions (Signed)
**  Handouts given on polyps and hemorrhoids**   YOU HAD AN ENDOSCOPIC PROCEDURE TODAY: Refer to the procedure report and other information in the discharge instructions given to you for any specific questions about what was found during the examination. If this information does not answer your questions, please call [place] office at [phone] to clarify.   YOU SHOULD EXPECT: Some feelings of bloating in the abdomen. Passage of more gas than usual. Walking can help get rid of the air that was put into your GI tract during the procedure and reduce the bloating. If you had a lower endoscopy (such as a colonoscopy or flexible sigmoidoscopy) you may notice spotting of blood in your stool or on the toilet paper. Some abdominal soreness may be present for a day or two, also.  DIET: Your first meal following the procedure should be a light meal and then it is ok to progress to your normal diet. A half-sandwich or bowl of soup is an example of a good first meal. Heavy or fried foods are harder to digest and may make you feel nauseous or bloated. Drink plenty of fluids but you should avoid alcoholic beverages for 24 hours. If you had a esophageal dilation, please see attached instructions for diet.    ACTIVITY: Your care partner should take you home directly after the procedure. You should plan to take it easy, moving slowly for the rest of the day. You can resume normal activity the day after the procedure however YOU SHOULD NOT DRIVE, use power tools, machinery or perform tasks that involve climbing or major physical exertion for 24 hours (because of the sedation medicines used during the test).   SYMPTOMS TO REPORT IMMEDIATELY: A gastroenterologist can be reached at any hour. Please call [phone]  for any of the following symptoms:  Following lower endoscopy (colonoscopy, flexible sigmoidoscopy) Excessive amounts of blood in the stool  Significant tenderness, worsening of abdominal pains  Swelling of  the abdomen that is new, acute  Fever of 100 or higher    FOLLOW UP:  If any biopsies were taken you will be contacted by phone or by letter within the next 1-3 weeks. Call [phone]  if you have not heard about the biopsies in 3 weeks.  Please also call with any specific questions about appointments or follow up tests.  

## 2017-06-11 NOTE — Progress Notes (Signed)
Pt's states no medical or surgical changes since previsit or office visit. 

## 2017-06-11 NOTE — Op Note (Signed)
[facility] Patient Name: Richard Morrow Procedure Date: 06/11/2017 8:37 AM MRN: [id number] Endoscopist: Mauri Pole , MD Age: 56 Referring MD:  Date of Birth: [birth year] Gender: Male Account #: [id number] Procedure:                Colonoscopy Indications:              Surveillance: Personal history of adenomatous                            polyps on last colonoscopy 5 years ago Medicines:                Monitored Anesthesia Care Procedure:                Pre-Anesthesia Assessment:                           - Prior to the procedure, a History and Physical                            was performed, and patient medications and                            allergies were reviewed. The patient's tolerance of                            previous anesthesia was also reviewed. The risks                            and benefits of the procedure and the sedation                            options and risks were discussed with the patient.                            All questions were answered, and informed consent                            was obtained. Prior Anticoagulants: The patient has                            taken no previous anticoagulant or antiplatelet                            agents. ASA Grade Assessment: II - A patient with                            mild systemic disease. After reviewing the risks                            and benefits, the patient was deemed in                            satisfactory condition to undergo the procedure.  After obtaining informed consent, the colonoscope                            was passed under direct vision. Throughout the                            procedure, the patient's blood pressure, pulse, and                            oxygen saturations were monitored continuously. The                            Colonoscope was introduced through the anus and                            advanced to the the  terminal ileum, with                            identification of the appendiceal orifice and IC                            valve. The colonoscopy was performed without                            difficulty. The patient tolerated the procedure                            well. The quality of the bowel preparation was                            excellent. The terminal ileum, ileocecal valve,                            appendiceal orifice, and rectum were photographed. Scope In: 9:00:02 AM Scope Out: 9:13:26 AM Scope Withdrawal Time: 0 hours 9 minutes 57 seconds  Total Procedure Duration: 0 hours 13 minutes 24 seconds  Findings:                 The perianal and digital rectal examinations were                            normal.                           Two sessile polyps were found in the rectum and                            ascending colon. The polyps were 5 to 7 mm in size.                            These polyps were removed with a cold snare.                            Resection and retrieval were complete.  Non-bleeding internal hemorrhoids were found during                            retroflexion. The hemorrhoids were small.                           The exam was otherwise without abnormality. Complications:            No immediate complications. Estimated Blood Loss:     Estimated blood loss was minimal. Impression:               - Two 5 to 7 mm polyps in the rectum and in the                            ascending colon, removed with a cold snare.                            Resected and retrieved.                           - Non-bleeding internal hemorrhoids.                           - The examination was otherwise normal. Recommendation:           - Patient has a contact number available for                            emergencies. The signs and symptoms of potential                            delayed complications were discussed with the                             patient. Return to normal activities tomorrow.                            Written discharge instructions were provided to the                            patient.                           - Resume previous diet.                           - Continue present medications.                           - Await pathology results.                           - Repeat colonoscopy in 5 years for surveillance                            based on pathology results. Mauri Pole, MD 06/11/2017 9:17:57 AM This report has been signed  electronically.

## 2017-06-11 NOTE — Progress Notes (Signed)
Called to room to assist during endoscopic procedure.  Patient ID and intended procedure confirmed with present staff. Received instructions for my participation in the procedure from the performing physician.  

## 2017-06-12 ENCOUNTER — Telehealth: Payer: Self-pay | Admitting: *Deleted

## 2017-06-12 NOTE — Telephone Encounter (Signed)
  Follow up Call-  Call back number 06/11/2017  Post procedure Call Back phone  # [phone]  Permission to leave phone message Yes  Some recent data might be hidden     Patient questions:  Message left to call us if necessary.

## 2017-06-12 NOTE — Telephone Encounter (Signed)
  Follow up Call-  Call back number 06/11/2017  Post procedure Call Back phone  # [phone]  Permission to leave phone message Yes  Some recent data might be hidden     Patient questions:  Do you have a fever, pain , or abdominal swelling? No. Pain Score  0 *  Have you tolerated food without any problems? Yes.    Have you been able to return to your normal activities? Yes.    Do you have any questions about your discharge instructions: Diet   No. Medications  No. Follow up visit  No.  Do you have questions or concerns about your Care? No.  Actions: * If pain score is 4 or above: No action needed, pain <4.

## 2017-06-22 ENCOUNTER — Encounter: Payer: Self-pay | Admitting: Gastroenterology

## 2017-06-29 DIAGNOSIS — M5412 Radiculopathy, cervical region: Secondary | ICD-10-CM | POA: Diagnosis not present

## 2017-06-29 DIAGNOSIS — Z6825 Body mass index (BMI) 25.0-25.9, adult: Secondary | ICD-10-CM | POA: Diagnosis not present

## 2017-06-29 DIAGNOSIS — R030 Elevated blood-pressure reading, without diagnosis of hypertension: Secondary | ICD-10-CM | POA: Diagnosis not present

## 2017-06-29 DIAGNOSIS — M5020 Other cervical disc displacement, unspecified cervical region: Secondary | ICD-10-CM | POA: Diagnosis not present

## 2017-07-24 DIAGNOSIS — M542 Cervicalgia: Secondary | ICD-10-CM | POA: Diagnosis not present

## 2017-07-24 DIAGNOSIS — M5020 Other cervical disc displacement, unspecified cervical region: Secondary | ICD-10-CM | POA: Diagnosis not present

## 2017-07-28 DIAGNOSIS — M5020 Other cervical disc displacement, unspecified cervical region: Secondary | ICD-10-CM | POA: Diagnosis not present

## 2017-07-28 DIAGNOSIS — M542 Cervicalgia: Secondary | ICD-10-CM | POA: Diagnosis not present

## 2017-08-04 DIAGNOSIS — M542 Cervicalgia: Secondary | ICD-10-CM | POA: Diagnosis not present

## 2017-08-04 DIAGNOSIS — M5020 Other cervical disc displacement, unspecified cervical region: Secondary | ICD-10-CM | POA: Diagnosis not present

## 2017-08-06 DIAGNOSIS — M5020 Other cervical disc displacement, unspecified cervical region: Secondary | ICD-10-CM | POA: Diagnosis not present

## 2017-08-06 DIAGNOSIS — M542 Cervicalgia: Secondary | ICD-10-CM | POA: Diagnosis not present

## 2017-08-12 DIAGNOSIS — M5020 Other cervical disc displacement, unspecified cervical region: Secondary | ICD-10-CM | POA: Diagnosis not present

## 2017-08-12 DIAGNOSIS — M542 Cervicalgia: Secondary | ICD-10-CM | POA: Diagnosis not present

## 2017-08-18 DIAGNOSIS — M5020 Other cervical disc displacement, unspecified cervical region: Secondary | ICD-10-CM | POA: Diagnosis not present

## 2017-08-18 DIAGNOSIS — M542 Cervicalgia: Secondary | ICD-10-CM | POA: Diagnosis not present

## 2017-08-20 DIAGNOSIS — M542 Cervicalgia: Secondary | ICD-10-CM | POA: Diagnosis not present

## 2017-08-20 DIAGNOSIS — M5020 Other cervical disc displacement, unspecified cervical region: Secondary | ICD-10-CM | POA: Diagnosis not present

## 2017-08-25 DIAGNOSIS — M542 Cervicalgia: Secondary | ICD-10-CM | POA: Diagnosis not present

## 2017-08-25 DIAGNOSIS — M5020 Other cervical disc displacement, unspecified cervical region: Secondary | ICD-10-CM | POA: Diagnosis not present

## 2017-08-27 DIAGNOSIS — M5020 Other cervical disc displacement, unspecified cervical region: Secondary | ICD-10-CM | POA: Diagnosis not present

## 2017-08-27 DIAGNOSIS — M542 Cervicalgia: Secondary | ICD-10-CM | POA: Diagnosis not present

## 2017-09-01 DIAGNOSIS — M542 Cervicalgia: Secondary | ICD-10-CM | POA: Diagnosis not present

## 2017-09-01 DIAGNOSIS — M5020 Other cervical disc displacement, unspecified cervical region: Secondary | ICD-10-CM | POA: Diagnosis not present

## 2017-09-03 DIAGNOSIS — M542 Cervicalgia: Secondary | ICD-10-CM | POA: Diagnosis not present

## 2017-09-03 DIAGNOSIS — M5020 Other cervical disc displacement, unspecified cervical region: Secondary | ICD-10-CM | POA: Diagnosis not present

## 2017-10-30 ENCOUNTER — Ambulatory Visit (INDEPENDENT_AMBULATORY_CARE_PROVIDER_SITE_OTHER): Payer: BLUE CROSS/BLUE SHIELD | Admitting: Internal Medicine

## 2017-10-30 ENCOUNTER — Other Ambulatory Visit (INDEPENDENT_AMBULATORY_CARE_PROVIDER_SITE_OTHER): Payer: BLUE CROSS/BLUE SHIELD

## 2017-10-30 ENCOUNTER — Encounter: Payer: Self-pay | Admitting: Internal Medicine

## 2017-10-30 VITALS — BP 122/84 | HR 68 | Temp 97.9°F | Ht 69.0 in | Wt 172.0 lb

## 2017-10-30 DIAGNOSIS — Z0000 Encounter for general adult medical examination without abnormal findings: Secondary | ICD-10-CM

## 2017-10-30 LAB — CBC WITH DIFFERENTIAL/PLATELET
BASOS ABS: 62 {cells}/uL (ref 0–200)
Basophils Relative: 0.8 %
EOS ABS: 262 {cells}/uL (ref 15–500)
Eosinophils Relative: 3.4 %
HEMATOCRIT: 43.3 % (ref 38.5–50.0)
HEMOGLOBIN: 15.2 g/dL (ref 13.2–17.1)
Lymphs Abs: 3172 cells/uL (ref 850–3900)
MCH: 31.3 pg (ref 27.0–33.0)
MCHC: 35.1 g/dL (ref 32.0–36.0)
MCV: 89.1 fL (ref 80.0–100.0)
MONOS PCT: 8.5 %
MPV: 10 fL (ref 7.5–12.5)
NEUTROS ABS: 3550 {cells}/uL (ref 1500–7800)
NEUTROS PCT: 46.1 %
Platelets: 266 10*3/uL (ref 140–400)
RBC: 4.86 10*6/uL (ref 4.20–5.80)
RDW: 12.4 % (ref 11.0–15.0)
Total Lymphocyte: 41.2 %
WBC mixed population: 655 cells/uL (ref 200–950)
WBC: 7.7 10*3/uL (ref 3.8–10.8)

## 2017-10-30 LAB — HEPATIC FUNCTION PANEL
ALBUMIN: 4.6 g/dL (ref 3.5–5.2)
ALT: 14 U/L (ref 0–53)
AST: 21 U/L (ref 0–37)
Alkaline Phosphatase: 52 U/L (ref 39–117)
BILIRUBIN TOTAL: 0.8 mg/dL (ref 0.2–1.2)
Bilirubin, Direct: 0.2 mg/dL (ref 0.0–0.3)
Total Protein: 7.1 g/dL (ref 6.0–8.3)

## 2017-10-30 LAB — LIPID PANEL
CHOL/HDL RATIO: 4
Cholesterol: 175 mg/dL (ref 0–200)
HDL: 49.4 mg/dL (ref >39.00)
LDL CALC: 97 mg/dL (ref 0–99)
NonHDL: 126.08
TRIGLYCERIDES: 145 mg/dL (ref 0.0–149.0)
VLDL: 29 mg/dL (ref 0.0–40.0)

## 2017-10-30 LAB — PSA: PSA: 1.15 ng/mL (ref 0.10–4.00)

## 2017-10-30 LAB — BASIC METABOLIC PANEL
BUN: 17 mg/dL (ref 6–23)
CALCIUM: 9.3 mg/dL (ref 8.4–10.5)
CHLORIDE: 101 meq/L (ref 96–112)
CO2: 32 meq/L (ref 19–32)
Creatinine, Ser: 0.85 mg/dL (ref 0.40–1.50)
GFR: 99.41 mL/min (ref >60.00)
GLUCOSE: 104 mg/dL — AB (ref 70–99)
POTASSIUM: 3.9 meq/L (ref 3.5–5.1)
SODIUM: 138 meq/L (ref 135–145)

## 2017-10-30 LAB — URINALYSIS, ROUTINE W REFLEX MICROSCOPIC
BILIRUBIN URINE: NEGATIVE
HGB URINE DIPSTICK: NEGATIVE
KETONES UR: NEGATIVE
LEUKOCYTES UA: NEGATIVE
NITRITE: NEGATIVE
Specific Gravity, Urine: <=1.005 — AB (ref 1.000–1.030)
Total Protein, Urine: NEGATIVE
URINE GLUCOSE: NEGATIVE
UROBILINOGEN UA: 0.2 (ref 0.0–1.0)
WBC, UA: NONE SEEN — AB (ref 0–?)
pH: 6.5 (ref 5.0–8.0)

## 2017-10-30 LAB — TSH: TSH: 1.48 u[IU]/mL (ref 0.35–4.50)

## 2017-10-30 MED ORDER — ATORVASTATIN CALCIUM 20 MG PO TABS: 20.0000 mg | ORAL_TABLET | Freq: Every day | ORAL | 3 refills | Status: DC

## 2017-10-30 MED ORDER — ASPIRIN 81 MG PO TBEC: 81.0000 mg | DELAYED_RELEASE_TABLET | Freq: Every day | ORAL | 12 refills | Status: DC

## 2017-10-30 NOTE — Addendum Note (Signed)
Addended by: Biagio Borg on: 10/30/2017 02:36 PM   Modules accepted: Orders

## 2017-10-30 NOTE — Patient Instructions (Signed)

## 2017-10-30 NOTE — Progress Notes (Addendum)
Subjective:    Patient ID: Richard Morrow, male    DOB: [birth year], 57 y.o.   MRN: [id number]  HPI  Here for wellness and f/u;  Overall doing ok;  Pt denies Chest pain, worsening SOB, DOE, wheezing, orthopnea, PND, worsening LE edema, palpitations, dizziness or syncope.  Pt denies neurological change such as new headache, facial or extremity weakness.  Pt denies polydipsia, polyuria, or low sugar symptoms. Pt states overall good compliance with treatment and medications, good tolerability, and has been trying to follow appropriate diet.  Pt denies worsening depressive symptoms, suicidal ideation or panic. No fever, night sweats, wt loss, loss of appetite, or other constitutional symptoms.  Pt states good ability with ADL's, has low fall risk, home safety reviewed and adequate, no other significant changes in hearing or vision, and very active with daily exercise at the gym most days of the week.  No new complaints or interval hx  Not taking asa 1 but willing to start.  Sees derm yearly Past Medical History:  Diagnosis Date  . ALLERGIC RHINITIS 05/18/2007   Qualifier: Diagnosis of  By: Dance CMA ([place]), Kim    . Allergy   . HYPERLIPIDEMIA 05/18/2007   Qualifier: Diagnosis of  By: Dance CMA ([place]), Kim    . Vasovagal syncope    issues with needles   Past Surgical History:  Procedure Laterality Date  . COLONOSCOPY    . rotater cuff repair     left    reports that  has never smoked. he has never used smokeless tobacco. He reports that he drinks alcohol. He reports that he does not use drugs. family history includes Alcohol abuse in his unknown relative; Allergic rhinitis in his sister; Colon cancer (age of onset: 55) in his mother; Heart disease in his father and mother; Prostate cancer in his paternal grandfather. No Known Allergies Current Outpatient Medications on File Prior to Visit  Medication Sig Dispense Refill  . atorvastatin (LIPITOR) 20 MG tablet TAKE ONE TABLET BY MOUTH ONCE DAILY  90 tablet 3  . clobetasol cream (TEMOVATE) 4.25 % Apply 1 application topically daily.    . clotrimazole-betamethasone (LOTRISONE) cream Apply 1 application topically as needed.    . desonide (DESOWEN) 0.05 % cream Apply 1 application topically daily.     Current Facility-Administered Medications on File Prior to Visit  Medication Dose Route Frequency Provider Last Rate Last Dose  . 0.9 %  sodium chloride infusion  500 mL Intravenous Continuous Nandigam, Kavitha V, MD        Review of Systems Constitutional: Negative for other unusual diaphoresis, sweats, appetite or weight changes HENT: Negative for other worsening hearing loss, ear pain, facial swelling, mouth sores or neck stiffness.   Eyes: Negative for other worsening pain, redness or other visual disturbance.  Respiratory: Negative for other stridor or swelling Cardiovascular: Negative for other palpitations or other chest pain  Gastrointestinal: Negative for worsening diarrhea or loose stools, blood in stool, distention or other pain Genitourinary: Negative for hematuria, flank pain or other change in urine volume.  Musculoskeletal: Negative for myalgias or other joint swelling.  Skin: Negative for other color change, or other wound or worsening drainage.  Neurological: Negative for other syncope or numbness. Hematological: Negative for other adenopathy or swelling Psychiatric/Behavioral: Negative for hallucinations, other worsening agitation, SI, self-injury, or new decreased concentration All other system neg per pt    Objective:   Physical Exam BP 122/84   Pulse 68   Temp 97.9 F (  36.6 C) (Oral)   Ht 5\' 9"  (1.753 m)   Wt 172 lb (78 kg)   SpO2 96%   BMI 25.40 kg/m  VS noted,  Constitutional: Pt is oriented to person, place, and time. Appears well-developed and well-nourished, in no significant distress and comfortable Head: Normocephalic and atraumatic  Eyes: Conjunctivae and EOM are normal. Pupils are equal, round,  and reactive to light Right Ear: External ear normal without discharge Left Ear: External ear normal without discharge Nose: Nose without discharge or deformity Mouth/Throat: Oropharynx is without other ulcerations and moist  Neck: Normal range of motion. Neck supple. No JVD present. No tracheal deviation present or significant neck LA or mass Cardiovascular: Normal rate, regular rhythm, normal heart sounds and intact distal pulses.   Pulmonary/Chest: WOB normal and breath sounds without rales or wheezing  Abdominal: Soft. Bowel sounds are normal. NT. No HSM  Musculoskeletal: Normal range of motion. Exhibits no edema Lymphadenopathy: Has no other cervical adenopathy.  Neurological: Pt is alert and oriented to person, place, and time. Pt has normal reflexes. No cranial nerve deficit. Motor grossly intact, Gait intact Skin: Skin is warm and dry. No rash noted or new ulcerations Psychiatric:  Has normal mood and affect. Behavior is normal without agitation No other exam findings Lab Results  Component Value Date   WBC 6.9 10/17/2016   HGB 15.5 10/17/2016   HCT 44.8 10/17/2016   PLT 305.0 10/17/2016   GLUCOSE 102 (H) 10/17/2016   CHOL 182 10/17/2016   TRIG 67.0 10/17/2016   HDL 58.00 10/17/2016   LDLDIRECT 149.6 06/25/2010   LDLCALC 111 (H) 10/17/2016   ALT 15 10/17/2016   AST 22 10/17/2016   NA 135 10/17/2016   K 4.4 10/17/2016   CL 98 10/17/2016   CREATININE 0.77 10/17/2016   BUN 10 10/17/2016   CO2 31 10/17/2016   TSH 1.05 10/17/2016   PSA 0.88 10/17/2016       Assessment & Plan:

## 2017-10-30 NOTE — Assessment & Plan Note (Signed)

## 2017-10-30 NOTE — Addendum Note (Signed)
Addended by: Isaiah Serge D on: 10/30/2017 02:32 PM   Modules accepted: Orders

## 2017-12-15 DIAGNOSIS — J22 Unspecified acute lower respiratory infection: Secondary | ICD-10-CM | POA: Diagnosis not present

## 2018-05-18 DIAGNOSIS — Z85828 Personal history of other malignant neoplasm of skin: Secondary | ICD-10-CM | POA: Diagnosis not present

## 2018-05-18 DIAGNOSIS — Z08 Encounter for follow-up examination after completed treatment for malignant neoplasm: Secondary | ICD-10-CM | POA: Diagnosis not present

## 2018-05-18 DIAGNOSIS — L570 Actinic keratosis: Secondary | ICD-10-CM | POA: Diagnosis not present

## 2018-05-18 DIAGNOSIS — L2089 Other atopic dermatitis: Secondary | ICD-10-CM | POA: Diagnosis not present

## 2018-05-18 DIAGNOSIS — L578 Other skin changes due to chronic exposure to nonionizing radiation: Secondary | ICD-10-CM | POA: Diagnosis not present

## 2018-11-07 ENCOUNTER — Other Ambulatory Visit: Payer: Self-pay | Admitting: Internal Medicine

## 2018-11-09 ENCOUNTER — Other Ambulatory Visit: Payer: Self-pay | Admitting: Internal Medicine

## 2018-11-09 ENCOUNTER — Telehealth: Payer: Self-pay

## 2018-11-09 MED ORDER — ATORVASTATIN CALCIUM 20 MG PO TABS: 20.0000 mg | ORAL_TABLET | Freq: Every day | ORAL | 0 refills | Status: DC

## 2018-11-09 NOTE — Telephone Encounter (Signed)
Pt already has lab orders waiting.   Copied from [id number]. Topic: Appointment Scheduling - Scheduling Inquiry for Clinic >> Nov 09, 2018  8:24 AM Lennox Solders wrote: Reason for CRM: pt is schedule for physical on 12-01-2018 and would like to have cpe labs prior.

## 2018-11-09 NOTE — Telephone Encounter (Signed)
Copied from [place] [id number]. Topic: General - Other >> Nov 09, 2018  8:25 AM Lennox Solders wrote: Reason for CRM:pt needs a refill on atorvastatin. Pt has a physical schedule for 12-01-2018. [facility] [address] in [place]

## 2018-11-09 NOTE — Telephone Encounter (Signed)
Requested Prescriptions  Pending Prescriptions Disp Refills  . atorvastatin (LIPITOR) 20 MG tablet 90 tablet 0    Sig: Take 1 tablet (20 mg total) by mouth daily.     Cardiovascular:  Antilipid - Statins Failed - 11/09/2018  8:28 AM      Failed - Total Cholesterol in normal range and within 360 days    Cholesterol  Date Value Ref Range Status  10/30/2017 175 0 - 200 mg/dL Final    Comment:    ATP III Classification       Desirable:  < 200 mg/dL               Borderline High:  200 - 239 mg/dL          High:  > = 240 mg/dL         Failed - LDL in normal range and within 360 days    LDL Cholesterol  Date Value Ref Range Status  10/30/2017 97 0 - 99 mg/dL Final         Failed - HDL in normal range and within 360 days    HDL  Date Value Ref Range Status  10/30/2017 49.40 >39.00 mg/dL Final         Failed - Triglycerides in normal range and within 360 days    Triglycerides  Date Value Ref Range Status  10/30/2017 145.0 0.0 - 149.0 mg/dL Final    Comment:    Normal:  <150 mg/dLBorderline High:  150 - 199 mg/dL         Failed - Valid encounter within last 12 months    Recent Outpatient Visits          1 year ago Preventative health care   [facility] -Georges Mouse, MD   2 years ago Encounter for well adult exam with abnormal findings   [facility] -Georges Mouse, MD   3 years ago Preventative health care   [facility] -Georges Mouse, MD   4 years ago Preventative health care   [facility] -Junie Bame, Hunt Oris, MD   5 years ago Preventative health care   [facility] -Georges Mouse, MD      Future Appointments            In 3 weeks Jenny Reichmann Hunt Oris, MD [place], [place] - Patient is not pregnant

## 2018-12-01 ENCOUNTER — Encounter: Payer: Self-pay | Admitting: Internal Medicine

## 2018-12-01 ENCOUNTER — Other Ambulatory Visit (INDEPENDENT_AMBULATORY_CARE_PROVIDER_SITE_OTHER): Payer: BLUE CROSS/BLUE SHIELD

## 2018-12-01 ENCOUNTER — Ambulatory Visit (INDEPENDENT_AMBULATORY_CARE_PROVIDER_SITE_OTHER): Payer: BLUE CROSS/BLUE SHIELD | Admitting: Internal Medicine

## 2018-12-01 ENCOUNTER — Other Ambulatory Visit: Payer: Self-pay

## 2018-12-01 VITALS — BP 116/78 | HR 58 | Temp 98.1°F | Ht 69.0 in | Wt 177.0 lb

## 2018-12-01 DIAGNOSIS — Z0000 Encounter for general adult medical examination without abnormal findings: Secondary | ICD-10-CM

## 2018-12-01 DIAGNOSIS — Z125 Encounter for screening for malignant neoplasm of prostate: Secondary | ICD-10-CM

## 2018-12-01 LAB — CBC WITH DIFFERENTIAL/PLATELET
Basophils Absolute: 0.1 10*3/uL (ref 0.0–0.1)
Basophils Relative: 1.4 % (ref 0.0–3.0)
Eosinophils Absolute: 0.2 10*3/uL (ref 0.0–0.7)
Eosinophils Relative: 4 % (ref 0.0–5.0)
HEMATOCRIT: 45.8 % (ref 39.0–52.0)
HEMOGLOBIN: 15.5 g/dL (ref 13.0–17.0)
Lymphocytes Relative: 38 % (ref 12.0–46.0)
Lymphs Abs: 2.3 10*3/uL (ref 0.7–4.0)
MCHC: 33.9 g/dL (ref 30.0–36.0)
MCV: 91.8 fl (ref 78.0–100.0)
MONO ABS: 0.5 10*3/uL (ref 0.1–1.0)
Monocytes Relative: 8.4 % (ref 3.0–12.0)
Neutro Abs: 2.9 10*3/uL (ref 1.4–7.7)
Neutrophils Relative %: 48.2 % (ref 43.0–77.0)
Platelets: 270 10*3/uL (ref 150.0–400.0)
RBC: 5 Mil/uL (ref 4.22–5.81)
RDW: 13.2 % (ref 11.5–15.5)
WBC: 6.1 10*3/uL (ref 4.0–10.5)

## 2018-12-01 LAB — URINALYSIS, ROUTINE W REFLEX MICROSCOPIC
Bilirubin Urine: NEGATIVE
Hgb urine dipstick: NEGATIVE
Ketones, ur: NEGATIVE
Leukocytes,Ua: NEGATIVE
Nitrite: NEGATIVE
RBC / HPF: NONE SEEN (ref 0–?)
Specific Gravity, Urine: 1.01 (ref 1.000–1.030)
Total Protein, Urine: NEGATIVE
Urine Glucose: NEGATIVE
Urobilinogen, UA: 0.2 (ref 0.0–1.0)
pH: 7.5 (ref 5.0–8.0)

## 2018-12-01 LAB — HEPATIC FUNCTION PANEL
ALT: 17 U/L (ref 0–53)
AST: 25 U/L (ref 0–37)
Albumin: 4.8 g/dL (ref 3.5–5.2)
Alkaline Phosphatase: 55 U/L (ref 39–117)
Bilirubin, Direct: 0.2 mg/dL (ref 0.0–0.3)
Total Bilirubin: 1.2 mg/dL (ref 0.2–1.2)
Total Protein: 7.1 g/dL (ref 6.0–8.3)

## 2018-12-01 LAB — PSA: PSA: 0.93 ng/mL (ref 0.10–4.00)

## 2018-12-01 LAB — LIPID PANEL
Cholesterol: 203 mg/dL — ABNORMAL HIGH (ref 0–200)
HDL: 62.1 mg/dL (ref >39.00)
LDL Cholesterol: 128 mg/dL — ABNORMAL HIGH (ref 0–99)
NonHDL: 140.56
Total CHOL/HDL Ratio: 3
Triglycerides: 63 mg/dL (ref 0.0–149.0)
VLDL: 12.6 mg/dL (ref 0.0–40.0)

## 2018-12-01 LAB — BASIC METABOLIC PANEL
BUN: 11 mg/dL (ref 6–23)
CO2: 30 meq/L (ref 19–32)
Calcium: 9.7 mg/dL (ref 8.4–10.5)
Chloride: 101 mEq/L (ref 96–112)
Creatinine, Ser: 0.77 mg/dL (ref 0.40–1.50)
GFR: 104.42 mL/min (ref >60.00)
Glucose, Bld: 99 mg/dL (ref 70–99)
Potassium: 4.2 mEq/L (ref 3.5–5.1)
Sodium: 139 mEq/L (ref 135–145)

## 2018-12-01 LAB — TSH: TSH: 1.27 u[IU]/mL (ref 0.35–4.50)

## 2018-12-01 MED ORDER — ATORVASTATIN CALCIUM 20 MG PO TABS: 20.0000 mg | ORAL_TABLET | Freq: Every day | ORAL | 11 refills | Status: DC

## 2018-12-01 NOTE — Assessment & Plan Note (Signed)

## 2018-12-01 NOTE — Patient Instructions (Signed)

## 2018-12-01 NOTE — Progress Notes (Signed)
Subjective:    Patient ID: Richard Morrow, male    DOB: 1961/06/19, 58 y.o.   MRN: [id number]  HPI  Here for wellness and f/u;  Overall doing ok;  Pt denies Chest pain, worsening SOB, DOE, wheezing, orthopnea, PND, worsening LE edema, palpitations, dizziness or syncope.  Pt denies neurological change such as new headache, facial or extremity weakness.  Pt denies polydipsia, polyuria, or low sugar symptoms. Pt states overall good compliance with treatment and medications, good tolerability, and has been trying to follow appropriate diet.  Pt denies worsening depressive symptoms, suicidal ideation or panic. No fever, night sweats, wt loss, loss of appetite, or other constitutional symptoms.  Pt states good ability with ADL's, has low fall risk, home safety reviewed and adequate, no other significant changes in hearing or vision, and only occasionally active with exercise. 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  No new complaints Past Medical History:  Diagnosis Date  . ALLERGIC RHINITIS 05/18/2007   Qualifier: Diagnosis of  By: Dance CMA ([place]), Kim    . Allergy   . HYPERLIPIDEMIA 05/18/2007   Qualifier: Diagnosis of  By: Dance CMA ([place]), Kim    . Vasovagal syncope    issues with needles   Past Surgical History:  Procedure Laterality Date  . COLONOSCOPY    . rotater cuff repair     left    reports that he has never smoked. He has never used smokeless tobacco. He reports current alcohol use. He reports that he does not use drugs. family history includes Alcohol abuse in his unknown relative; Allergic rhinitis in his sister; Colon cancer (age of onset: 28) in his mother; Heart disease in his father and mother; Prostate cancer in his paternal grandfather. No Known Allergies Current Outpatient Medications on File Prior to Visit  Medication Sig Dispense Refill  . hydrocortisone 2.5 % cream APPLY CREAM TO AFFECTED AREA TWICE DAILY    . triamcinolone cream (KENALOG)  0.1 % Apply 1 application topically 2 (two) times daily.     No current facility-administered medications on file prior to visit.    Review of Systems Constitutional: Negative for other unusual diaphoresis, sweats, appetite or weight changes HENT: Negative for other worsening hearing loss, ear pain, facial swelling, mouth sores or neck stiffness.   Eyes: Negative for other worsening pain, redness or other visual disturbance.  Respiratory: Negative for other stridor or swelling Cardiovascular: Negative for other palpitations or other chest pain  Gastrointestinal: Negative for worsening diarrhea or loose stools, blood in stool, distention or other pain Genitourinary: Negative for hematuria, flank pain or other change in urine volume.  Musculoskeletal: Negative for myalgias or other joint swelling.  Skin: Negative for other color change, or other wound or worsening drainage.  Neurological: Negative for other syncope or numbness. Hematological: Negative for other adenopathy or swelling Psychiatric/Behavioral: Negative for hallucinations, other worsening agitation, SI, self-injury, or new decreased concentration All other system neg per pt    Objective:   Physical Exam BP 116/78   Pulse (!) 58   Temp 98.1 F (36.7 C) (Oral)   Ht 5\' 9"  (1.753 m)   Wt 177 lb (80.3 kg)   SpO2 95%   BMI 26.14 kg/m  VS noted,  Constitutional: Pt is oriented to person, place, and time. Appears well-developed and well-nourished, in no significant distress and comfortable Head: Normocephalic and atraumatic  Eyes: Conjunctivae and EOM are normal. Pupils are equal, round, and reactive to light Right Ear:  External ear normal without discharge Left Ear: External ear normal without discharge Nose: Nose without discharge or deformity Mouth/Throat: Oropharynx is without other ulcerations and moist  Neck: Normal range of motion. Neck supple. No JVD present. No tracheal deviation present or significant neck LA or  mass Cardiovascular: Normal rate, regular rhythm, normal heart sounds and intact distal pulses.   Pulmonary/Chest: WOB normal and breath sounds without rales or wheezing  Abdominal: Soft. Bowel sounds are normal. NT. No HSM  Musculoskeletal: Normal range of motion. Exhibits no edema Lymphadenopathy: Has no other cervical adenopathy.  Neurological: Pt is alert and oriented to person, place, and time. Pt has normal reflexes. No cranial nerve deficit. Motor grossly intact, Gait intact Skin: Skin is warm and dry. No rash noted or new ulcerations Psychiatric:  Has normal mood and affect. Behavior is normal without agitation No other exam findings Lab Results  Component Value Date   WBC 7.7 10/30/2017   HGB 15.2 10/30/2017   HCT 43.3 10/30/2017   PLT 266 10/30/2017   GLUCOSE 104 (H) 10/30/2017   CHOL 175 10/30/2017   TRIG 145.0 10/30/2017   HDL 49.40 10/30/2017   LDLDIRECT 149.6 06/25/2010   LDLCALC 97 10/30/2017   ALT 14 10/30/2017   AST 21 10/30/2017   NA 138 10/30/2017   K 3.9 10/30/2017   CL 101 10/30/2017   CREATININE 0.85 10/30/2017   BUN 17 10/30/2017   CO2 32 10/30/2017   TSH 1.48 10/30/2017   PSA 1.15 10/30/2017       Assessment & Plan:

## 2019-05-24 DIAGNOSIS — Z08 Encounter for follow-up examination after completed treatment for malignant neoplasm: Secondary | ICD-10-CM | POA: Diagnosis not present

## 2019-05-24 DIAGNOSIS — L578 Other skin changes due to chronic exposure to nonionizing radiation: Secondary | ICD-10-CM | POA: Diagnosis not present

## 2019-05-24 DIAGNOSIS — Z85828 Personal history of other malignant neoplasm of skin: Secondary | ICD-10-CM | POA: Diagnosis not present

## 2019-05-24 DIAGNOSIS — L570 Actinic keratosis: Secondary | ICD-10-CM | POA: Diagnosis not present

## 2019-05-24 DIAGNOSIS — L821 Other seborrheic keratosis: Secondary | ICD-10-CM | POA: Diagnosis not present

## 2019-11-25 ENCOUNTER — Other Ambulatory Visit (INDEPENDENT_AMBULATORY_CARE_PROVIDER_SITE_OTHER): Payer: BC Managed Care – PPO

## 2019-11-25 ENCOUNTER — Other Ambulatory Visit: Payer: Self-pay

## 2019-11-25 DIAGNOSIS — Z0000 Encounter for general adult medical examination without abnormal findings: Secondary | ICD-10-CM

## 2019-11-25 LAB — URINALYSIS, ROUTINE W REFLEX MICROSCOPIC
Bilirubin Urine: NEGATIVE
Hgb urine dipstick: NEGATIVE
Ketones, ur: NEGATIVE
Leukocytes,Ua: NEGATIVE
Nitrite: NEGATIVE
Specific Gravity, Urine: 1.02 (ref 1.000–1.030)
Total Protein, Urine: NEGATIVE
Urine Glucose: NEGATIVE
Urobilinogen, UA: 0.2 (ref 0.0–1.0)
pH: 7.5 (ref 5.0–8.0)

## 2019-11-25 LAB — HEPATIC FUNCTION PANEL
ALT: 16 U/L (ref 0–53)
AST: 23 U/L (ref 0–37)
Albumin: 4.3 g/dL (ref 3.5–5.2)
Alkaline Phosphatase: 55 U/L (ref 39–117)
Bilirubin, Direct: 0.1 mg/dL (ref 0.0–0.3)
Total Bilirubin: 0.6 mg/dL (ref 0.2–1.2)
Total Protein: 6.7 g/dL (ref 6.0–8.3)

## 2019-11-25 LAB — CBC WITH DIFFERENTIAL/PLATELET
Basophils Absolute: 0.1 10*3/uL (ref 0.0–0.1)
Basophils Relative: 1.1 % (ref 0.0–3.0)
Eosinophils Absolute: 0.3 10*3/uL (ref 0.0–0.7)
Eosinophils Relative: 5.9 % — ABNORMAL HIGH (ref 0.0–5.0)
HCT: 43.5 % (ref 39.0–52.0)
Hemoglobin: 14.8 g/dL (ref 13.0–17.0)
Lymphocytes Relative: 40.9 % (ref 12.0–46.0)
Lymphs Abs: 2.1 10*3/uL (ref 0.7–4.0)
MCHC: 34 g/dL (ref 30.0–36.0)
MCV: 92.7 fl (ref 78.0–100.0)
Monocytes Absolute: 0.4 10*3/uL (ref 0.1–1.0)
Monocytes Relative: 8 % (ref 3.0–12.0)
Neutro Abs: 2.3 10*3/uL (ref 1.4–7.7)
Neutrophils Relative %: 44.1 % (ref 43.0–77.0)
Platelets: 251 10*3/uL (ref 150.0–400.0)
RBC: 4.69 Mil/uL (ref 4.22–5.81)
RDW: 13.3 % (ref 11.5–15.5)
WBC: 5.2 10*3/uL (ref 4.0–10.5)

## 2019-11-25 LAB — LIPID PANEL
Cholesterol: 192 mg/dL (ref 0–200)
HDL: 52.4 mg/dL (ref >39.00)
LDL Cholesterol: 124 mg/dL — ABNORMAL HIGH (ref 0–99)
NonHDL: 139.75
Total CHOL/HDL Ratio: 4
Triglycerides: 78 mg/dL (ref 0.0–149.0)
VLDL: 15.6 mg/dL (ref 0.0–40.0)

## 2019-11-25 LAB — BASIC METABOLIC PANEL
BUN: 13 mg/dL (ref 6–23)
CO2: 30 mEq/L (ref 19–32)
Calcium: 9.4 mg/dL (ref 8.4–10.5)
Chloride: 103 mEq/L (ref 96–112)
Creatinine, Ser: 0.83 mg/dL (ref 0.40–1.50)
GFR: 95.42 mL/min (ref >60.00)
Glucose, Bld: 96 mg/dL (ref 70–99)
Potassium: 4.5 mEq/L (ref 3.5–5.1)
Sodium: 139 mEq/L (ref 135–145)

## 2019-11-25 LAB — PSA: PSA: 0.99 ng/mL (ref 0.10–4.00)

## 2019-11-25 LAB — TSH: TSH: 1.24 u[IU]/mL (ref 0.35–4.50)

## 2019-12-02 ENCOUNTER — Encounter: Payer: Self-pay | Admitting: Internal Medicine

## 2019-12-02 ENCOUNTER — Ambulatory Visit (INDEPENDENT_AMBULATORY_CARE_PROVIDER_SITE_OTHER): Payer: BC Managed Care – PPO | Admitting: Internal Medicine

## 2019-12-02 ENCOUNTER — Other Ambulatory Visit: Payer: Self-pay

## 2019-12-02 VITALS — BP 118/82 | HR 73 | Temp 97.9°F | Ht 69.0 in | Wt 176.8 lb

## 2019-12-02 DIAGNOSIS — E785 Hyperlipidemia, unspecified: Secondary | ICD-10-CM

## 2019-12-02 DIAGNOSIS — Z0000 Encounter for general adult medical examination without abnormal findings: Secondary | ICD-10-CM | POA: Diagnosis not present

## 2019-12-02 DIAGNOSIS — Z23 Encounter for immunization: Secondary | ICD-10-CM

## 2019-12-02 NOTE — Assessment & Plan Note (Signed)
Declines increased statin, goal ldl < 100

## 2019-12-02 NOTE — Patient Instructions (Addendum)
You had the Tdap tetanus shot today  Please continue all other medications as before, and refills have been done if requested.  Please have the pharmacy call with any other refills you may need.  Please continue your efforts at being more active, low cholesterol diet, and weight control.  You are otherwise up to date with prevention measures today.  Please keep your appointments with your specialists as you may have planned  Please make an Appointment to return for your 1 year visit, or sooner if needed, with Lab testing by Appointment as well, to be done about 3-5 days before at the FIRST FLOOR Lab (so this is for TWO appointments - please see the scheduling desk as you leave)    

## 2019-12-02 NOTE — Progress Notes (Signed)
Subjective:    Patient ID: Richard Morrow, male    DOB: 26-Dec-1960, 59 y.o.   MRN: [id number]  HPI  Here for wellness and f/u;  Overall doing ok;  Pt denies Chest pain, worsening SOB, DOE, wheezing, orthopnea, PND, worsening LE edema, palpitations, dizziness or syncope.  Pt denies neurological change such as new headache, facial or extremity weakness.  Pt denies polydipsia, polyuria, or low sugar symptoms. Pt states overall good compliance with treatment and medications, good tolerability, and has been trying to follow appropriate diet.  Pt denies worsening depressive symptoms, suicidal ideation or panic. No fever, night sweats, wt loss, loss of appetite, or other constitutional symptoms.  Pt states good ability with ADL's, has low fall risk, home safety reviewed and adequate, no other significant changes in hearing or vision, and only occasionally active with exercise. BP Readings from Last 3 Encounters:  12/02/19 118/82  12/01/18 116/78  10/30/17 122/84   Wt Readings from Last 3 Encounters:  12/02/19 176 lb 12.8 oz (80.2 kg)  12/01/18 177 lb (80.3 kg)  10/30/17 172 lb (78 kg)   Past Medical History:  Diagnosis Date  . ALLERGIC RHINITIS 05/18/2007   Qualifier: Diagnosis of  By: Dance CMA ([place]), Kim    . Allergy   . HYPERLIPIDEMIA 05/18/2007   Qualifier: Diagnosis of  By: Dance CMA ([place]), Kim    . Vasovagal syncope    issues with needles   Past Surgical History:  Procedure Laterality Date  . COLONOSCOPY    . rotater cuff repair     left    reports that he has never smoked. He has never used smokeless tobacco. He reports current alcohol use. He reports that he does not use drugs. family history includes Alcohol abuse in an other family member; Allergic rhinitis in his sister; Colon cancer (age of onset: 30) in his mother; Heart disease in his father and mother; Prostate cancer in his paternal grandfather. No Known Allergies Current Outpatient Medications on File Prior to Visit    Medication Sig Dispense Refill  . atorvastatin (LIPITOR) 20 MG tablet Take 1 tablet (20 mg total) by mouth daily. 30 tablet 11  . hydrocortisone 2.5 % cream APPLY CREAM TO AFFECTED AREA TWICE DAILY    . triamcinolone cream (KENALOG) 0.1 % Apply 1 application topically 2 (two) times daily.     No current facility-administered medications on file prior to visit.   Review of Systems All otherwise neg per pt     Objective:   Physical Exam BP 118/82   Pulse 73   Temp 97.9 F (36.6 C)   Ht 5\' 9"  (1.753 m)   Wt 176 lb 12.8 oz (80.2 kg)   SpO2 98%   BMI 26.11 kg/m  VS noted,  Constitutional: Pt appears in NAD HENT: Head: NCAT.  Right Ear: External ear normal.  Left Ear: External ear normal.  Eyes: . Pupils are equal, round, and reactive to light. Conjunctivae and EOM are normal Nose: without d/c or deformity Neck: Neck supple. Gross normal ROM Cardiovascular: Normal rate and regular rhythm.   Pulmonary/Chest: Effort normal and breath sounds without rales or wheezing.  Abd:  Soft, NT, ND, + BS, no organomegaly Neurological: Pt is alert. At baseline orientation, motor grossly intact Skin: Skin is warm. No rashes, other new lesions, no LE edema Psychiatric: Pt behavior is normal without agitation  All otherwise neg per pt Lab Results  Component Value Date   WBC 5.2 11/25/2019   HGB  14.8 11/25/2019   HCT 43.5 11/25/2019   PLT 251.0 11/25/2019   GLUCOSE 96 11/25/2019   CHOL 192 11/25/2019   TRIG 78.0 11/25/2019   HDL 52.40 11/25/2019   LDLDIRECT 149.6 06/25/2010   LDLCALC 124 (H) 11/25/2019   ALT 16 11/25/2019   AST 23 11/25/2019   NA 139 11/25/2019   K 4.5 11/25/2019   CL 103 11/25/2019   CREATININE 0.83 11/25/2019   BUN 13 11/25/2019   CO2 30 11/25/2019   TSH 1.24 11/25/2019   PSA 0.99 11/25/2019      Assessment & Plan:

## 2019-12-04 ENCOUNTER — Encounter: Payer: Self-pay | Admitting: Internal Medicine

## 2019-12-04 NOTE — Assessment & Plan Note (Signed)

## 2019-12-28 ENCOUNTER — Other Ambulatory Visit: Payer: Self-pay

## 2019-12-28 MED ORDER — ATORVASTATIN CALCIUM 20 MG PO TABS: 20.0000 mg | ORAL_TABLET | Freq: Every day | ORAL | 11 refills | Status: DC

## 2020-11-29 ENCOUNTER — Other Ambulatory Visit (INDEPENDENT_AMBULATORY_CARE_PROVIDER_SITE_OTHER): Payer: BC Managed Care – PPO

## 2020-11-29 DIAGNOSIS — Z0000 Encounter for general adult medical examination without abnormal findings: Secondary | ICD-10-CM

## 2020-11-29 DIAGNOSIS — Z125 Encounter for screening for malignant neoplasm of prostate: Secondary | ICD-10-CM | POA: Diagnosis not present

## 2020-11-29 LAB — CBC WITH DIFFERENTIAL/PLATELET
Basophils Absolute: 0 10*3/uL (ref 0.0–0.1)
Basophils Relative: 0.8 % (ref 0.0–3.0)
Eosinophils Absolute: 0.3 10*3/uL (ref 0.0–0.7)
Eosinophils Relative: 5.2 % — ABNORMAL HIGH (ref 0.0–5.0)
HCT: 45.5 % (ref 39.0–52.0)
Hemoglobin: 15.4 g/dL (ref 13.0–17.0)
Lymphocytes Relative: 45.8 % (ref 12.0–46.0)
Lymphs Abs: 2.4 10*3/uL (ref 0.7–4.0)
MCHC: 34 g/dL (ref 30.0–36.0)
MCV: 91.6 fl (ref 78.0–100.0)
Monocytes Absolute: 0.5 10*3/uL (ref 0.1–1.0)
Monocytes Relative: 9.5 % (ref 3.0–12.0)
Neutro Abs: 2 10*3/uL (ref 1.4–7.7)
Neutrophils Relative %: 38.7 % — ABNORMAL LOW (ref 43.0–77.0)
Platelets: 250 10*3/uL (ref 150.0–400.0)
RBC: 4.96 Mil/uL (ref 4.22–5.81)
RDW: 12.7 % (ref 11.5–15.5)
WBC: 5.2 10*3/uL (ref 4.0–10.5)

## 2020-11-29 LAB — URINALYSIS, ROUTINE W REFLEX MICROSCOPIC
Bilirubin Urine: NEGATIVE
Hgb urine dipstick: NEGATIVE
Ketones, ur: NEGATIVE
Leukocytes,Ua: NEGATIVE
Nitrite: NEGATIVE
RBC / HPF: NONE SEEN (ref 0–?)
Specific Gravity, Urine: 1.02 (ref 1.000–1.030)
Total Protein, Urine: NEGATIVE
Urine Glucose: NEGATIVE
Urobilinogen, UA: 0.2 (ref 0.0–1.0)
WBC, UA: NONE SEEN (ref 0–?)
pH: 7.5 (ref 5.0–8.0)

## 2020-11-29 LAB — BASIC METABOLIC PANEL
BUN: 12 mg/dL (ref 6–23)
CO2: 31 mEq/L (ref 19–32)
Calcium: 9.4 mg/dL (ref 8.4–10.5)
Chloride: 101 mEq/L (ref 96–112)
Creatinine, Ser: 0.88 mg/dL (ref 0.40–1.50)
GFR: 94.97 mL/min (ref >60.00)
Glucose, Bld: 105 mg/dL — ABNORMAL HIGH (ref 70–99)
Potassium: 4.1 mEq/L (ref 3.5–5.1)
Sodium: 138 mEq/L (ref 135–145)

## 2020-11-29 LAB — HEPATIC FUNCTION PANEL
ALT: 21 U/L (ref 0–53)
AST: 25 U/L (ref 0–37)
Albumin: 4.3 g/dL (ref 3.5–5.2)
Alkaline Phosphatase: 51 U/L (ref 39–117)
Bilirubin, Direct: 0.2 mg/dL (ref 0.0–0.3)
Total Bilirubin: 1 mg/dL (ref 0.2–1.2)
Total Protein: 6.8 g/dL (ref 6.0–8.3)

## 2020-11-29 LAB — PSA: PSA: 1.15 ng/mL (ref 0.10–4.00)

## 2020-11-29 LAB — LIPID PANEL
Cholesterol: 183 mg/dL (ref 0–200)
HDL: 49.4 mg/dL (ref >39.00)
LDL Cholesterol: 115 mg/dL — ABNORMAL HIGH (ref 0–99)
NonHDL: 133.47
Total CHOL/HDL Ratio: 4
Triglycerides: 94 mg/dL (ref 0.0–149.0)
VLDL: 18.8 mg/dL (ref 0.0–40.0)

## 2020-11-29 LAB — TSH: TSH: 1.7 u[IU]/mL (ref 0.35–4.50)

## 2020-11-30 ENCOUNTER — Other Ambulatory Visit: Payer: Self-pay

## 2020-12-03 ENCOUNTER — Encounter: Payer: BC Managed Care – PPO | Admitting: Internal Medicine

## 2020-12-03 DIAGNOSIS — Z0289 Encounter for other administrative examinations: Secondary | ICD-10-CM

## 2020-12-06 NOTE — Telephone Encounter (Addendum)
Patient came in today on Thursday 3/17 for the appointment. Informed patient his appointment was Monday 3/14. Patient refused to reschedule appointment.   Patient came back and apologized. And rescheduled the appointment.

## 2020-12-18 ENCOUNTER — Other Ambulatory Visit: Payer: Self-pay

## 2020-12-19 ENCOUNTER — Encounter: Payer: Self-pay | Admitting: Internal Medicine

## 2020-12-19 ENCOUNTER — Ambulatory Visit (INDEPENDENT_AMBULATORY_CARE_PROVIDER_SITE_OTHER): Payer: BC Managed Care – PPO | Admitting: Internal Medicine

## 2020-12-19 VITALS — BP 130/80 | HR 72 | Wt 174.8 lb

## 2020-12-19 DIAGNOSIS — E785 Hyperlipidemia, unspecified: Secondary | ICD-10-CM | POA: Diagnosis not present

## 2020-12-19 DIAGNOSIS — Z0000 Encounter for general adult medical examination without abnormal findings: Secondary | ICD-10-CM | POA: Diagnosis not present

## 2020-12-19 DIAGNOSIS — E538 Deficiency of other specified B group vitamins: Secondary | ICD-10-CM

## 2020-12-19 DIAGNOSIS — E559 Vitamin D deficiency, unspecified: Secondary | ICD-10-CM | POA: Diagnosis not present

## 2020-12-19 MED ORDER — ATORVASTATIN CALCIUM 40 MG PO TABS: 40.0000 mg | ORAL_TABLET | Freq: Every day | ORAL | 3 refills | Status: DC

## 2020-12-19 NOTE — Patient Instructions (Signed)
Ok to increase the lipitor to 40 mg per day  We have discussed the Cardiac CT Score test to measure the calcification level (if any) in your heart arteries.  This test has been ordered in our [facility], so please call [facility] CT directly, as they prefer this, at [phone] to be scheduled.  Please continue all other medications as before, and refills have been done if requested.  Please have the pharmacy call with any other refills you may need.  Please continue your efforts at being more active, low cholesterol diet, and weight control.  You are otherwise up to date with prevention measures today.  Please keep your appointments with your specialists as you may have planned  Please make an Appointment to return for your 1 year visit, or sooner if needed, with Lab testing by Appointment as well, to be done about 3-5 days before at the [facility] (so this is for TWO appointments - please see the scheduling desk as you leave)  Due to the ongoing Covid 19 pandemic, our lab now requires an appointment for any labs done at our office.  If you need labs done and do not have an appointment, please call our office ahead of time to schedule before presenting to the lab for your testing.

## 2020-12-19 NOTE — Progress Notes (Signed)
Patient ID: Richard Morrow, male   DOB: 1961/08/03, 60 y.o.   MRN: [id number]         Chief Complaint:: wellness exam and hld       HPI:  Richard Morrow is a 60 y.o. male here for wellness exam; up to date with preventive referrals and immunizations; declines covid booster.  Trying to follow lower chol diet.  He is interested in Cardiac Ct scoring for risk stratification  Pt denies chest pain, increased sob or doe, wheezing, orthopnea, PND, increased LE swelling, palpitations, dizziness or syncope.  Denies new focal neuro s/s.   Pt denies polydipsia, polyuria,  Pt denies fever, wt loss, night sweats, loss of appetite, or other constitutional symptoms   Wt Readings from Last 3 Encounters:  12/19/20 174 lb 12.8 oz (79.3 kg)  12/02/19 176 lb 12.8 oz (80.2 kg)  12/01/18 177 lb (80.3 kg)   BP Readings from Last 3 Encounters:  12/19/20 130/80  12/02/19 118/82  12/01/18 116/78   Immunization History  Administered Date(s) Administered  . Influenza Whole 06/25/2010  . Influenza, Quadrivalent, Recombinant, Inj, Pf 07/22/2018  . Influenza-Unspecified 07/01/2015, 07/06/2016, 07/23/2017, 07/30/2018, 08/31/2020  . Janssen (J&J) SARS-COV-2 Vaccination 02/17/2020  . Td 02/23/2009  . Tdap 12/02/2019   There are no preventive care reminders to display for this patient.    Past Medical History:  Diagnosis Date  . ALLERGIC RHINITIS 05/18/2007   Qualifier: Diagnosis of  By: Dance CMA ([place]), Kim    . Allergy   . HYPERLIPIDEMIA 05/18/2007   Qualifier: Diagnosis of  By: Dance CMA ([place]), Kim    . Vasovagal syncope    issues with needles   Past Surgical History:  Procedure Laterality Date  . COLONOSCOPY    . rotater cuff repair     left    reports that he has never smoked. He has never used smokeless tobacco. He reports current alcohol use. He reports that he does not use drugs. family history includes Alcohol abuse in an other family member; Allergic rhinitis in his sister; Colon cancer (age  of onset: 54) in his mother; Heart disease in his father and mother; Prostate cancer in his paternal grandfather. No Known Allergies Current Outpatient Medications on File Prior to Visit  Medication Sig Dispense Refill  . hydrocortisone 2.5 % cream APPLY CREAM TO AFFECTED AREA TWICE DAILY    . triamcinolone cream (KENALOG) 0.1 % Apply 1 application topically 2 (two) times daily.     No current facility-administered medications on file prior to visit.        ROS:  All others reviewed and negative.  Objective        PE:  BP 130/80 (BP Location: Right Arm, Patient Position: Sitting, Cuff Size: Normal)   Pulse 72   Wt 174 lb 12.8 oz (79.3 kg)   SpO2 97%   BMI 25.81 kg/m                 Constitutional: Pt appears in NAD               HENT: Head: NCAT.                Right Ear: External ear normal.                 Left Ear: External ear normal.                Eyes: . Pupils are equal, round, and reactive to light. Conjunctivae  and EOM are normal               Nose: without d/c or deformity               Neck: Neck supple. Gross normal ROM               Cardiovascular: Normal rate and regular rhythm.                 Pulmonary/Chest: Effort normal and breath sounds without rales or wheezing.                Abd:  Soft, NT, ND, + BS, no organomegaly               Neurological: Pt is alert. At baseline orientation, motor grossly intact               Skin: Skin is warm. No rashes, no other new lesions, LE edema - none               Psychiatric: Pt behavior is normal without agitation   Micro: none  Cardiac tracings I have personally interpreted today:  none  Pertinent Radiological findings (summarize): none   Lab Results  Component Value Date   WBC 5.2 11/29/2020   HGB 15.4 11/29/2020   HCT 45.5 11/29/2020   PLT 250.0 11/29/2020   GLUCOSE 105 (H) 11/29/2020   CHOL 183 11/29/2020   TRIG 94.0 11/29/2020   HDL 49.40 11/29/2020   LDLDIRECT 149.6 06/25/2010   LDLCALC 115 (H)  11/29/2020   ALT 21 11/29/2020   AST 25 11/29/2020   NA 138 11/29/2020   K 4.1 11/29/2020   CL 101 11/29/2020   CREATININE 0.88 11/29/2020   BUN 12 11/29/2020   CO2 31 11/29/2020   TSH 1.70 11/29/2020   PSA 1.15 11/29/2020   Assessment/Plan:  [name] is a 60 y.o. White or Caucasian [1] male with  has a past medical history of ALLERGIC RHINITIS (05/18/2007), Allergy, HYPERLIPIDEMIA (05/18/2007), and Vasovagal syncope.  Hyperlipidemia Uncontrolled - to increase the lipitor 40 qd; also for ct cardiac score, which would change the goal ldl to < 70 if abnormal  Preventative health care Age and sex appropriate education and counseling updated with regular exercise and diet Referrals for preventative services - none needed Immunizations addressed - none needed Smoking counseling  - none needed Evidence for depression or other mood disorder - none significant Most recent labs reviewed. I have personally reviewed and have noted: 1) the patient's medical and social history 2) The patient's current medications and supplements 3) The patient's height, weight, and BMI have been recorded in the chart   Followup: Return in about 1 year (around 12/19/2021).  Cathlean Cower, MD 12/23/2020 3:56 PM [facility]

## 2020-12-19 NOTE — Assessment & Plan Note (Signed)
Uncontrolled - to increase the lipitor 40 qd; also for ct cardiac score, which would change the goal ldl to < 70 if abnormal

## 2020-12-23 ENCOUNTER — Encounter: Payer: Self-pay | Admitting: Internal Medicine

## 2020-12-23 NOTE — Assessment & Plan Note (Signed)

## 2021-01-03 ENCOUNTER — Encounter: Payer: Self-pay | Admitting: Internal Medicine

## 2021-11-26 ENCOUNTER — Other Ambulatory Visit: Payer: Self-pay | Admitting: Internal Medicine

## 2021-11-26 NOTE — Telephone Encounter (Signed)
Please refill as per office routine med refill policy (all routine meds to be refilled for 3 mo or monthly (per pt preference) up to one year from last visit, then month to month grace period for 3 mo, then further med refills will have to be denied) ? ?

## 2021-12-10 DIAGNOSIS — L570 Actinic keratosis: Secondary | ICD-10-CM | POA: Diagnosis not present

## 2021-12-10 DIAGNOSIS — Z85828 Personal history of other malignant neoplasm of skin: Secondary | ICD-10-CM | POA: Diagnosis not present

## 2021-12-10 DIAGNOSIS — D225 Melanocytic nevi of trunk: Secondary | ICD-10-CM | POA: Diagnosis not present

## 2021-12-10 DIAGNOSIS — L820 Inflamed seborrheic keratosis: Secondary | ICD-10-CM | POA: Diagnosis not present

## 2021-12-10 DIAGNOSIS — L821 Other seborrheic keratosis: Secondary | ICD-10-CM | POA: Diagnosis not present

## 2021-12-10 DIAGNOSIS — D239 Other benign neoplasm of skin, unspecified: Secondary | ICD-10-CM | POA: Diagnosis not present

## 2021-12-29 ENCOUNTER — Encounter: Payer: Self-pay | Admitting: Internal Medicine

## 2021-12-31 ENCOUNTER — Other Ambulatory Visit (INDEPENDENT_AMBULATORY_CARE_PROVIDER_SITE_OTHER): Payer: BC Managed Care – PPO

## 2021-12-31 DIAGNOSIS — E538 Deficiency of other specified B group vitamins: Secondary | ICD-10-CM | POA: Diagnosis not present

## 2021-12-31 DIAGNOSIS — Z125 Encounter for screening for malignant neoplasm of prostate: Secondary | ICD-10-CM | POA: Diagnosis not present

## 2021-12-31 DIAGNOSIS — Z0000 Encounter for general adult medical examination without abnormal findings: Secondary | ICD-10-CM

## 2021-12-31 DIAGNOSIS — E559 Vitamin D deficiency, unspecified: Secondary | ICD-10-CM | POA: Diagnosis not present

## 2021-12-31 DIAGNOSIS — E785 Hyperlipidemia, unspecified: Secondary | ICD-10-CM | POA: Diagnosis not present

## 2021-12-31 LAB — LIPID PANEL
Cholesterol: 154 mg/dL (ref 0–200)
HDL: 48.7 mg/dL (ref >39.00)
LDL Cholesterol: 88 mg/dL (ref 0–99)
NonHDL: 105.56
Total CHOL/HDL Ratio: 3
Triglycerides: 88 mg/dL (ref 0.0–149.0)
VLDL: 17.6 mg/dL (ref 0.0–40.0)

## 2021-12-31 LAB — VITAMIN B12: Vitamin B-12: 360 pg/mL (ref 211–911)

## 2021-12-31 LAB — CBC WITH DIFFERENTIAL/PLATELET
Basophils Absolute: 0.1 10*3/uL (ref 0.0–0.1)
Basophils Relative: 1.2 % (ref 0.0–3.0)
Eosinophils Absolute: 0.3 10*3/uL (ref 0.0–0.7)
Eosinophils Relative: 6.5 % — ABNORMAL HIGH (ref 0.0–5.0)
HCT: 44.9 % (ref 39.0–52.0)
Hemoglobin: 15.2 g/dL (ref 13.0–17.0)
Lymphocytes Relative: 45.7 % (ref 12.0–46.0)
Lymphs Abs: 2.1 10*3/uL (ref 0.7–4.0)
MCHC: 33.8 g/dL (ref 30.0–36.0)
MCV: 92.3 fl (ref 78.0–100.0)
Monocytes Absolute: 0.5 10*3/uL (ref 0.1–1.0)
Monocytes Relative: 11 % (ref 3.0–12.0)
Neutro Abs: 1.7 10*3/uL (ref 1.4–7.7)
Neutrophils Relative %: 35.6 % — ABNORMAL LOW (ref 43.0–77.0)
Platelets: 246 10*3/uL (ref 150.0–400.0)
RBC: 4.86 Mil/uL (ref 4.22–5.81)
RDW: 13 % (ref 11.5–15.5)
WBC: 4.7 10*3/uL (ref 4.0–10.5)

## 2021-12-31 LAB — URINALYSIS, ROUTINE W REFLEX MICROSCOPIC
Bilirubin Urine: NEGATIVE
Hgb urine dipstick: NEGATIVE
Ketones, ur: NEGATIVE
Leukocytes,Ua: NEGATIVE
Nitrite: NEGATIVE
RBC / HPF: NONE SEEN (ref 0–?)
Specific Gravity, Urine: 1.01 (ref 1.000–1.030)
Total Protein, Urine: NEGATIVE
Urine Glucose: NEGATIVE
Urobilinogen, UA: 0.2 (ref 0.0–1.0)
WBC, UA: NONE SEEN (ref 0–?)
pH: 8 (ref 5.0–8.0)

## 2021-12-31 LAB — HEPATIC FUNCTION PANEL
ALT: 14 U/L (ref 0–53)
AST: 20 U/L (ref 0–37)
Albumin: 4.7 g/dL (ref 3.5–5.2)
Alkaline Phosphatase: 49 U/L (ref 39–117)
Bilirubin, Direct: 0.2 mg/dL (ref 0.0–0.3)
Total Bilirubin: 1.2 mg/dL (ref 0.2–1.2)
Total Protein: 6.8 g/dL (ref 6.0–8.3)

## 2021-12-31 LAB — BASIC METABOLIC PANEL
BUN: 14 mg/dL (ref 6–23)
CO2: 29 mEq/L (ref 19–32)
Calcium: 9.7 mg/dL (ref 8.4–10.5)
Chloride: 100 mEq/L (ref 96–112)
Creatinine, Ser: 0.86 mg/dL (ref 0.40–1.50)
GFR: 94.9 mL/min (ref >60.00)
Glucose, Bld: 94 mg/dL (ref 70–99)
Potassium: 4.2 mEq/L (ref 3.5–5.1)
Sodium: 138 mEq/L (ref 135–145)

## 2021-12-31 LAB — PSA: PSA: 1 ng/mL (ref 0.10–4.00)

## 2021-12-31 LAB — TSH: TSH: 1.42 u[IU]/mL (ref 0.35–5.50)

## 2021-12-31 LAB — VITAMIN D 25 HYDROXY (VIT D DEFICIENCY, FRACTURES): VITD: 26.56 ng/mL — ABNORMAL LOW (ref 30.00–100.00)

## 2022-01-07 ENCOUNTER — Ambulatory Visit (INDEPENDENT_AMBULATORY_CARE_PROVIDER_SITE_OTHER): Payer: BC Managed Care – PPO | Admitting: Internal Medicine

## 2022-01-07 ENCOUNTER — Encounter: Payer: Self-pay | Admitting: Internal Medicine

## 2022-01-07 VITALS — BP 132/82 | HR 68 | Temp 98.2°F | Ht 69.0 in | Wt 173.0 lb

## 2022-01-07 DIAGNOSIS — R739 Hyperglycemia, unspecified: Secondary | ICD-10-CM | POA: Diagnosis not present

## 2022-01-07 DIAGNOSIS — R9431 Abnormal electrocardiogram [ECG] [EKG]: Secondary | ICD-10-CM

## 2022-01-07 DIAGNOSIS — E559 Vitamin D deficiency, unspecified: Secondary | ICD-10-CM | POA: Insufficient documentation

## 2022-01-07 DIAGNOSIS — E538 Deficiency of other specified B group vitamins: Secondary | ICD-10-CM

## 2022-01-07 DIAGNOSIS — E7800 Pure hypercholesterolemia, unspecified: Secondary | ICD-10-CM | POA: Diagnosis not present

## 2022-01-07 DIAGNOSIS — Z0001 Encounter for general adult medical examination with abnormal findings: Secondary | ICD-10-CM | POA: Diagnosis not present

## 2022-01-07 DIAGNOSIS — N453 Epididymo-orchitis: Secondary | ICD-10-CM | POA: Diagnosis not present

## 2022-01-07 DIAGNOSIS — M7711 Lateral epicondylitis, right elbow: Secondary | ICD-10-CM | POA: Diagnosis not present

## 2022-01-07 MED ORDER — DOXYCYCLINE HYCLATE 100 MG PO TABS: 100.0000 mg | ORAL_TABLET | Freq: Two times a day (BID) | ORAL | 0 refills | Status: DC

## 2022-01-07 MED ORDER — ATORVASTATIN CALCIUM 40 MG PO TABS
40.0000 mg | ORAL_TABLET | Freq: Every day | ORAL | 3 refills | Status: DC
Start: 2022-01-07 — End: 2022-02-21

## 2022-01-07 NOTE — Patient Instructions (Addendum)
Please take all new medication as prescribed - the antibiotic ? ?Ok to also use the Voltaren gel as needed for the right elbow pain (and other pains) ? ?We have discussed the Cardiac CT Score test to measure the calcification level (if any) in your heart arteries.  This test has been ordered in our [place], so please call [place] CT directly, as they prefer this, at [phone] to be scheduled. ? ?Please continue all other medications as before, and refills have been done if requested. ? ?Please have the pharmacy call with any other refills you may need. ? ?Please continue your efforts at being more active, low cholesterol diet, and weight control. ? ?You are otherwise up to date with prevention measures today. ? ?Please keep your appointments with your specialists as you may have planned ? ?Please make an Appointment to return for your 1 year visit, or sooner if needed, with Lab testing by Appointment as well, to be done about 3-5 days before at the [facility] (so this is for TWO appointments - please see the scheduling desk as you leave) ? ?Due to the ongoing Covid 19 pandemic, our lab now requires an appointment for any labs done at our office.  If you need labs done and do not have an appointment, please call our office ahead of time to schedule before presenting to the lab for your testing. ? ?

## 2022-01-07 NOTE — Progress Notes (Signed)
Patient ID: Richard Morrow, male   DOB: 07-Sep-1961, 61 y.o.   MRN: [id number] ? ? ? ?     Chief Complaint:: wellness exam and Annual Exam (Possible tennis elbow; patient c/o having discomfort in right scrotum ) ? , hld, right lateral elbow pain ? ?     HPI:  Richard Morrow is a 61 y.o. male here for wellness exam; declines shingrix, o/w up to date ?         ?              Also c/o 1 wk onset right testicle pain, swelling though Denies urinary symptoms such as dysuria, frequency, urgency, flank pain, hematuria or n/v, fever, chills.  Also has tender area to right lateral elbow with mild swelling, worse to pick up thing over 5 lbs.  Trying to follow loewr chol diet.  Pt denies chest pain, increased sob or doe, wheezing, orthopnea, PND, increased LE swelling, palpitations, dizziness or syncope.   Pt denies polydipsia, polyuria, or new focal neuro s/s.   Pt denies fever, wt loss, night sweats, loss of appetite, or other constitutional symptoms  No oyther new complaints ?  ?Wt Readings from Last 3 Encounters:  ?01/07/22 173 lb (78.5 kg)  ?12/19/20 174 lb 12.8 oz (79.3 kg)  ?12/02/19 176 lb 12.8 oz (80.2 kg)  ? ?BP Readings from Last 3 Encounters:  ?01/07/22 132/82  ?12/19/20 130/80  ?12/02/19 118/82  ? ?Immunization History  ?Administered Date(s) Administered  ? Influenza Whole 06/25/2010  ? Influenza, Quadrivalent, Recombinant, Inj, Pf 07/22/2018  ? Influenza-Unspecified 07/01/2015, 07/06/2016, 07/23/2017, 07/30/2018, 08/31/2020  ? Janssen (J&J) SARS-COV-2 Vaccination 02/17/2020  ? Td 02/23/2009  ? Tdap 12/02/2019  ? ?There are no preventive care reminders to display for this patient. ? ?  ? ?Past Medical History:  ?Diagnosis Date  ? ALLERGIC RHINITIS 05/18/2007  ? Qualifier: Diagnosis of  By: Dance CMA ([place]), Kim    ? Allergy   ? HYPERLIPIDEMIA 05/18/2007  ? Qualifier: Diagnosis of  By: Dance CMA ([place]), Kim    ? Vasovagal syncope   ? issues with needles  ? ?Past Surgical History:  ?Procedure Laterality Date  ?  COLONOSCOPY    ? rotater cuff repair    ? left  ? ? reports that he has never smoked. He has never used smokeless tobacco. He reports current alcohol use. He reports that he does not use drugs. ?family history includes Alcohol abuse in an other family member; Allergic rhinitis in his sister; Cancer - Other in his mother; Colon cancer (age of onset: 57) in his mother; Heart disease in his father and mother; Liver cancer in his mother; Lung cancer in his mother; Prostate cancer in his paternal grandfather. ?No Known Allergies ?Current Outpatient Medications on File Prior to Visit  ?Medication Sig Dispense Refill  ? hydrocortisone 2.5 % cream APPLY CREAM TO AFFECTED AREA TWICE DAILY    ? triamcinolone cream (KENALOG) 0.1 % Apply 1 application topically 2 (two) times daily.    ? ?No current facility-administered medications on file prior to visit.  ? ?     ROS:  All others reviewed and negative. ? ?Objective  ? ?     PE:  BP 132/82 (BP Location: Right Arm, Patient Position: Sitting, Cuff Size: Normal)   Pulse 68   Temp 98.2 ?F (36.8 ?C) (Oral)   Ht '5\' 9"'$  (1.753 m)   Wt 173 lb (78.5 kg)   SpO2 97%   BMI 25.55  kg/m?  ? ?              Constitutional: Pt appears in NAD ?              HENT: Head: NCAT.  ?              Right Ear: External ear normal.   ?              Left Ear: External ear normal.  ?              Eyes: . Pupils are equal, round, and reactive to light. Conjunctivae and EOM are normal ?              Nose: without d/c or deformity ?              Neck: Neck supple. Gross normal ROM ?              Cardiovascular: Normal rate and regular rhythm.   ?              Pulmonary/Chest: Effort normal and breath sounds without rales or wheezing.  ?              Abd:  Soft, NT, ND, + BS, no organomegaly ?              Has mild tender right alteral epicondylar ?              Right testicle 1+ swelling tender ?              Neurological: Pt is alert. At baseline orientation, motor grossly intact ?              Skin: Skin  is warm. No rashes, no other new lesions, LE edema - none ?              Psychiatric: Pt behavior is normal without agitation  ? ?Micro: none ? ?Cardiac tracings I have personally interpreted today:  none ? ?Pertinent Radiological findings (summarize): none  ? ?Lab Results  ?Component Value Date  ? WBC 4.7 12/31/2021  ? HGB 15.2 12/31/2021  ? HCT 44.9 12/31/2021  ? PLT 246.0 12/31/2021  ? GLUCOSE 94 12/31/2021  ? CHOL 154 12/31/2021  ? TRIG 88.0 12/31/2021  ? HDL 48.70 12/31/2021  ? LDLDIRECT 149.6 06/25/2010  ? [place] 88 12/31/2021  ? ALT 14 12/31/2021  ? AST 20 12/31/2021  ? NA 138 12/31/2021  ? K 4.2 12/31/2021  ? CL 100 12/31/2021  ? CREATININE 0.86 12/31/2021  ? BUN 14 12/31/2021  ? CO2 29 12/31/2021  ? TSH 1.42 12/31/2021  ? PSA 1.00 12/31/2021  ? ?Assessment/Plan:  ?Richard Morrow is a 61 y.o. White or Caucasian [1] male with  has a past medical history of ALLERGIC RHINITIS (05/18/2007), Allergy, HYPERLIPIDEMIA (05/18/2007), and Vasovagal syncope. ? ?Vitamin D deficiency ?Last vitamin D ?Lab Results  ?Component Value Date  ? VD25OH 26.56 (L) 12/31/2021  ? ?Low, to start oral replacement ? ? ?Encounter for well adult exam with abnormal findings ?Age and sex appropriate education and counseling updated with regular exercise and diet ?Referrals for preventative services - none needed ?Immunizations addressed - declines shingrix ?Smoking counseling  - none needed ?Evidence for depression or other mood disorder - none significant ?Most recent labs reviewed. ?I have personally reviewed and have noted: ?1) the patient's medical and social history ?2) The patient's current medications and supplements ?3) The patient's height, weight,  and BMI have been recorded in the chart ? ? ?Hyperlipidemia ?Lab Results  ?Component Value Date  ? [place] 88 12/31/2021  ? ?Stable, pt to continue current statin lipitor 40; also for cardiac Ct score ? ? ?Orchitis and epididymitis ?Mild to mod, for antibx course,  to f/u any worsening  symptoms or concerns ? ?Right lateral epicondylitis ?Mild, for volt gel prn,  to f/u any worsening symptoms or concerns ? ?Followup: Return in about 1 year (around 01/08/2023). ? ?Cathlean Cower, MD 01/11/2022 9:38 PM ?[place] ?[place] ?Internal Medicine ?

## 2022-01-07 NOTE — Assessment & Plan Note (Signed)
Last vitamin D ?Lab Results  ?Component Value Date  ? VD25OH 26.56 (L) 12/31/2021  ? ?Low, to start oral replacement ? ?

## 2022-01-11 ENCOUNTER — Encounter: Payer: Self-pay | Admitting: Internal Medicine

## 2022-01-11 DIAGNOSIS — M7711 Lateral epicondylitis, right elbow: Secondary | ICD-10-CM | POA: Insufficient documentation

## 2022-01-11 NOTE — Assessment & Plan Note (Signed)

## 2022-01-11 NOTE — Assessment & Plan Note (Signed)
Mild, for volt gel prn,  to f/u any worsening symptoms or concerns ?

## 2022-01-11 NOTE — Assessment & Plan Note (Signed)
Mild to mod, for antibx course,  to f/u any worsening symptoms or concerns 

## 2022-01-11 NOTE — Addendum Note (Signed)
Addended by: Biagio Borg on: 01/11/2022 09:40 PM ? ? Modules accepted: Orders ? ?

## 2022-01-11 NOTE — Assessment & Plan Note (Signed)
Lab Results  ?Component Value Date  ? [place] 88 12/31/2021  ? ?Stable, pt to continue current statin lipitor 40; also for cardiac Ct score ? ?

## 2022-02-21 ENCOUNTER — Ambulatory Visit
Admission: RE | Admit: 2022-02-21 | Discharge: 2022-02-21 | Disposition: A | Discharge status: Home / Self Care | Payer: Self-pay | Source: Ambulatory Visit | Attending: Internal Medicine | Admitting: Internal Medicine

## 2022-02-21 ENCOUNTER — Encounter: Payer: Self-pay | Admitting: Internal Medicine

## 2022-02-21 ENCOUNTER — Other Ambulatory Visit: Payer: Self-pay | Admitting: Internal Medicine

## 2022-02-21 DIAGNOSIS — I2510 Atherosclerotic heart disease of native coronary artery without angina pectoris: Secondary | ICD-10-CM

## 2022-02-21 DIAGNOSIS — I700 Atherosclerosis of aorta: Secondary | ICD-10-CM | POA: Insufficient documentation

## 2022-02-21 DIAGNOSIS — E7800 Pure hypercholesterolemia, unspecified: Secondary | ICD-10-CM

## 2022-02-21 MED ORDER — ATORVASTATIN CALCIUM 80 MG PO TABS: 80.0000 mg | ORAL_TABLET | Freq: Every day | ORAL | 3 refills | Status: DC

## 2022-02-21 MED ORDER — ASPIRIN 81 MG PO TBEC: 81.0000 mg | DELAYED_RELEASE_TABLET | Freq: Every day | ORAL | 12 refills | Status: AC

## 2022-02-24 ENCOUNTER — Other Ambulatory Visit: Payer: Self-pay | Admitting: Internal Medicine

## 2022-03-01 NOTE — Progress Notes (Unsigned)
Cardiology Office Note   Date:  03/05/2022   ID:  RIAZ ONORATO, DOB 06-05-1961, MRN [id number]  PCP:  Biagio Borg, MD  Cardiologist:   Maya Arcand Martinique, MD   Chief Complaint  Patient presents with   Coronary Artery Disease      History of Present Illness: Richard Morrow is a 61 y.o. male who is seen at the request of Dr Jenny Reichmann for evaluation of coronary calcification noted on CT. He has a history of HLD. He had a recent coronary calcium score showing a score of 135 placing him at 75th percentile for age/sex. He is very active and has no cardiac symptoms. Denies any chest pain or SOB. No palpitations. Has been on cholesterol therapy for 20 years and had lipitor recently increased to 80 mg. Does not smoke. Family history of vascular disease. Generally very healthy. Eats a heart healthy diet.     Past Medical History:  Diagnosis Date   ALLERGIC RHINITIS 05/18/2007   Qualifier: Diagnosis of  By: Dance CMA ([facility]), Kim     Allergy    HYPERLIPIDEMIA 05/18/2007   Qualifier: Diagnosis of  By: Dance CMA ([facility]), Kim     Vasovagal syncope    issues with needles    Past Surgical History:  Procedure Laterality Date   COLONOSCOPY     rotater cuff repair     left   VASECTOMY       Current Outpatient Medications  Medication Sig Dispense Refill   aspirin EC 81 MG tablet Take 1 tablet (81 mg total) by mouth daily. Swallow whole. 30 tablet 12   atorvastatin (LIPITOR) 80 MG tablet Take 1 tablet (80 mg total) by mouth daily. 90 tablet 3   hydrocortisone 2.5 % cream APPLY CREAM TO AFFECTED AREA TWICE DAILY     triamcinolone cream (KENALOG) 0.1 % Apply 1 application topically 2 (two) times daily.     No current facility-administered medications for this visit.    Allergies:   Patient has no known allergies.    Social History:  The patient  reports that he has never smoked. He has never used smokeless tobacco. He reports current alcohol use. He reports that he does not use drugs.    Family History:  The patient's family history includes Alcohol abuse in an other family member; Allergic rhinitis in his sister; Cancer - Other in his mother; Colon cancer (age of onset: 48) in his mother; Heart disease in his father and mother; Liver cancer in his mother; Lung cancer in his mother; Peripheral Artery Disease in his mother; Prostate cancer in his paternal grandfather.    ROS:  Please see the history of present illness.   Otherwise, review of systems are positive for none.   All other systems are reviewed and negative.    PHYSICAL EXAM: VS:  BP 134/85 (BP Location: Left Arm, Patient Position: Sitting, Cuff Size: Normal)   Pulse 62   Ht '5\' 9"'$  (1.753 m)   Wt 167 lb 3.2 oz (75.8 kg)   SpO2 98%   BMI 24.69 kg/m  , BMI Body mass index is 24.69 kg/m. GEN: Well nourished, well developed, in no acute distress HEENT: normal Neck: no JVD, carotid bruits, or masses Cardiac: RRR; no murmurs, rubs, or gallops,no edema  Respiratory:  clear to auscultation bilaterally, normal work of breathing GI: soft, nontender, nondistended, + BS MS: no deformity or atrophy Skin: warm and dry, no rash Neuro:  Strength and sensation are intact Psych:  euthymic mood, full affect   EKG:  EKG is ordered today. The ekg ordered today demonstrates NSR rate 62. T wave abnormality c/w anterior ischemia. This has progressed compared to old Ecgs. I have personally reviewed and interpreted this study.    Recent Labs: 12/31/2021: ALT 14; BUN 14; Creatinine, Ser 0.86; Hemoglobin 15.2; Platelets 246.0; Potassium 4.2; Sodium 138; TSH 1.42    Lipid Panel    Component Value Date/Time   CHOL 154 12/31/2021 0733   TRIG 88.0 12/31/2021 0733   HDL 48.70 12/31/2021 0733   CHOLHDL 3 12/31/2021 0733   VLDL 17.6 12/31/2021 0733   LDLCALC 88 12/31/2021 0733   LDLDIRECT 149.6 06/25/2010 0839      Wt Readings from Last 3 Encounters:  03/05/22 167 lb 3.2 oz (75.8 kg)  01/07/22 173 lb (78.5 kg)  12/19/20  174 lb 12.8 oz (79.3 kg)      Other studies Reviewed: Additional studies/ records that were reviewed today include:  ADDENDUM REPORT: 02/21/2022 08:22   CLINICAL DATA:  Risk stratification   EXAM: Coronary Calcium Score   TECHNIQUE: The patient was scanned on a Siemens Somatom 64 slice scanner. Axial non-contrast 3 mm slices were carried out through the heart. The data set was analyzed on a dedicated work station and scored using the [place].   FINDINGS: Non-cardiac: See separate report from [facility].   Ascending aorta: Upper normal diameter 3.7 cm   Pericardium: Normal   Coronary arteries: Calcium noted in RCA and LAD   LM: 0   LAD 80.5   LCX: 0   RCA 54.7   Total 135   IMPRESSION: Coronary calcium score of 135. This was 28 tj percentile for age and sex matched control.   Jenkins Rouge     Electronically Signed   By: Jenkins Rouge M.D.   On: 02/21/2022 08:22        ASSESSMENT AND PLAN:  1.  CAD. Calcium score is 135. He is completely asymptomatic. The concern is that his Ecg shows T wave inversion c/w anterior ischemia. This was present in V3 and V4 in the past but is more pronounced. I have recommended an ETT. If this looks good would continue with risk factor modification. Agree with ASA daily and lipid lowering therapy with goal LDL<70. Continue healthy lifestyle.  2. Hypercholesterolemia. Goal LDL < 70. Lipitor recently increased. If on follow up still not at goal would add Zetia.    Current medicines are reviewed at length with the patient today.  The patient does not have concerns regarding medicines.  The following changes have been made:  no change  Labs/ tests ordered today include:  ETT       Disposition:   FU TBD depending on results of ETT  Signed, Cherolyn Behrle Martinique, MD  03/05/2022 10:04 AM    [facility] [address], [place], [place], [address] Phone [phone], Fax [phone]

## 2022-03-05 ENCOUNTER — Ambulatory Visit (INDEPENDENT_AMBULATORY_CARE_PROVIDER_SITE_OTHER): Payer: BC Managed Care – PPO | Admitting: Cardiology

## 2022-03-05 ENCOUNTER — Encounter: Payer: Self-pay | Admitting: Cardiology

## 2022-03-05 VITALS — BP 134/85 | HR 62 | Ht 69.0 in | Wt 167.2 lb

## 2022-03-05 DIAGNOSIS — I2510 Atherosclerotic heart disease of native coronary artery without angina pectoris: Secondary | ICD-10-CM

## 2022-03-05 DIAGNOSIS — E7800 Pure hypercholesterolemia, unspecified: Secondary | ICD-10-CM

## 2022-03-05 DIAGNOSIS — R9431 Abnormal electrocardiogram [ECG] [EKG]: Secondary | ICD-10-CM | POA: Diagnosis not present

## 2022-03-05 NOTE — Patient Instructions (Addendum)
Medication Instructions:  NO CHANGES      Lab Work:  NOT NEEDED   Testing/Procedures:  [place]   Your physician has requested that you have an exercise tolerance test. For further information please visit [email or web address]. Please also follow instruction sheet, as given.   Follow-Up: At [facility], you and your health needs are our priority.  As part of our continuing mission to provide you with exceptional heart care, we have created designated Provider Care Teams.  These Care Teams include your primary Cardiologist (physician) and Advanced Practice Providers (APPs -  Physician Assistants and Nurse Practitioners) who all work together to provide you with the care you need, when you need it.     Your next appointment:     AS NEEDED  The format for your next appointment:   In Person  Provider:   Peter Martinique, MD    INSTRUCTION [phone]   Your doctor has scheduled you   The stress test involve walking on a treadmill.  The test will take approximately 1 to 1 1/2 hours to complete.   How to prepare for your test: Do not eat or drink 2 hours prior to your test Do not consume products containing caffeine 12 hours prior to your test (examples: coffee (regular OR decaf), chocolate, sodas, tea) Your doctor may need you to hold certain medications prior to the test.  If so, these are listed below and should not be taken for 24 hours prior to the test.  If not listed below, you may take your medications as normal.  You may resume taking held medications on your normal schedule once the test is complete.   Meds to hold: none Do bring a list of your current medications with you.  If you have held any meds in preparation for the test, please bring them, as you may be required to take them once the test is completed. Do wear comfortable clothes and walking shoes.  Do not wear dresses or overalls. Do NOT wear cologne, perfume, aftershave, or fragranced lotions the  day of your test (deodorants okay). If these instructions are not followed your test will have to be rescheduled.   A nuclear cardiologist will review your test, prepare a report and send it to your physician.   If you have questions or concerns about your appointment, you can call the Nuclear Cardiology department at [phone]. If you cannot keep your appointment, please provide 48 hours notification to avoid a possible $50.00 charge to your account.   Please arrive 15 minutes prior to your appointment time for registration and insurance purposes

## 2022-03-28 ENCOUNTER — Ambulatory Visit (HOSPITAL_COMMUNITY)
Admission: RE | Admit: 2022-03-28 | Discharge: 2022-03-28 | Disposition: A | Discharge status: Home / Self Care | Payer: BC Managed Care – PPO | Source: Ambulatory Visit | Attending: Cardiovascular Disease | Admitting: Cardiovascular Disease

## 2022-03-28 ENCOUNTER — Other Ambulatory Visit (HOSPITAL_COMMUNITY): Payer: Self-pay | Admitting: Cardiology

## 2022-03-28 DIAGNOSIS — R931 Abnormal findings on diagnostic imaging of heart and coronary circulation: Secondary | ICD-10-CM | POA: Insufficient documentation

## 2022-03-28 LAB — EXERCISE TOLERANCE TEST
Angina Index: 0
Duke Treadmill Score: 15
Estimated workload: 17.6
Exercise duration (min): 15 min
Exercise duration (sec): 16 s
MPHR: 161 {beats}/min
Peak HR: 162 {beats}/min
Percent HR: 100 %
Rest HR: 64 {beats}/min
ST Depression (mm): 0 mm

## 2022-04-10 ENCOUNTER — Encounter: Payer: Self-pay | Admitting: Internal Medicine

## 2022-04-10 DIAGNOSIS — Z86010 Personal history of colonic polyps: Secondary | ICD-10-CM

## 2022-04-17 MED ORDER — ATORVASTATIN CALCIUM 80 MG PO TABS: 80.0000 mg | ORAL_TABLET | Freq: Every day | ORAL | 3 refills | Status: DC

## 2022-04-17 NOTE — Telephone Encounter (Signed)
I would not recommend reducing lipitor, since the LDL cholesterol will increase back to over 100 which would be uncontrolled and lead to future heart attack or stroke due to worsening blockages over time

## 2022-04-17 NOTE — Telephone Encounter (Signed)
Pls advise on email . Pt want to take half of the atorvastatin pls advise..Princella Pellegrini

## 2022-04-17 NOTE — Addendum Note (Signed)
Addended by: Earnstine Regal on: 04/17/2022 01:11 PM   Modules accepted: Orders

## 2022-04-18 ENCOUNTER — Encounter: Payer: Self-pay | Admitting: Gastroenterology

## 2022-05-12 ENCOUNTER — Ambulatory Visit (AMBULATORY_SURGERY_CENTER): Payer: BC Managed Care – PPO

## 2022-05-12 VITALS — Ht 69.0 in | Wt 165.0 lb

## 2022-05-12 DIAGNOSIS — Z86010 Personal history of colonic polyps: Secondary | ICD-10-CM

## 2022-05-12 MED ORDER — NA SULFATE-K SULFATE-MG SULF 17.5-3.13-1.6 GM/177ML PO SOLN: 1.0000 | ORAL | 0 refills | Status: DC

## 2022-05-12 NOTE — Progress Notes (Signed)
No egg or soy allergy known to patient  No issues known to pt with past sedation with any surgeries or procedures Patient denies ever being told they had issues or difficulty with intubation  No FH of Malignant Hyperthermia Pt is not on diet pills Pt is not on  home 02  Pt is not on blood thinners  Pt denies issues with constipation  No A fib or A flutter Have any cardiac testing pending--denied Pt instructed to use [employer] or GoodRx for a price reduction on prep   PV via telephone. Instructions and sample consent mailed to verified address.

## 2022-05-23 ENCOUNTER — Encounter: Payer: Self-pay | Admitting: Gastroenterology

## 2022-06-09 ENCOUNTER — Encounter: Payer: Self-pay | Admitting: Gastroenterology

## 2022-06-09 ENCOUNTER — Ambulatory Visit (AMBULATORY_SURGERY_CENTER): Payer: BC Managed Care – PPO | Admitting: Gastroenterology

## 2022-06-09 VITALS — BP 117/62 | HR 60 | Temp 98.9°F | Resp 15 | Ht 69.0 in | Wt 165.0 lb

## 2022-06-09 DIAGNOSIS — K635 Polyp of colon: Secondary | ICD-10-CM | POA: Diagnosis not present

## 2022-06-09 DIAGNOSIS — Z86010 Personal history of colonic polyps: Secondary | ICD-10-CM | POA: Diagnosis not present

## 2022-06-09 DIAGNOSIS — Z1211 Encounter for screening for malignant neoplasm of colon: Secondary | ICD-10-CM | POA: Diagnosis not present

## 2022-06-09 DIAGNOSIS — D124 Benign neoplasm of descending colon: Secondary | ICD-10-CM

## 2022-06-09 DIAGNOSIS — Z09 Encounter for follow-up examination after completed treatment for conditions other than malignant neoplasm: Secondary | ICD-10-CM | POA: Diagnosis not present

## 2022-06-09 MED ORDER — SODIUM CHLORIDE 0.9 % IV SOLN: 500.0000 mL | Freq: Once | INTRAVENOUS | Status: DC

## 2022-06-09 NOTE — Progress Notes (Signed)
VSS, transported to PACU °

## 2022-06-09 NOTE — Patient Instructions (Signed)
Read all of the handouts given to you by your recovery room nurse.  YOU HAD AN ENDOSCOPIC PROCEDURE TODAY AT THE [facility]:   Refer to the procedure report that was given to you for any specific questions about what was found during the examination.  If the procedure report does not answer your questions, please call your gastroenterologist to clarify.  If you requested that your care partner not be given the details of your procedure findings, then the procedure report has been included in a sealed envelope for you to review at your convenience later.  YOU SHOULD EXPECT: Some feelings of bloating in the abdomen. Passage of more gas than usual.  Walking can help get rid of the air that was put into your GI tract during the procedure and reduce the bloating. If you had a lower endoscopy (such as a colonoscopy or flexible sigmoidoscopy) you may notice spotting of blood in your stool or on the toilet paper. If you underwent a bowel prep for your procedure, you may not have a normal bowel movement for a few days.  Please Note:  You might notice some irritation and congestion in your nose or some drainage.  This is from the oxygen used during your procedure.  There is no need for concern and it should clear up in a day or so.  SYMPTOMS TO REPORT IMMEDIATELY:  Following lower endoscopy (colonoscopy or flexible sigmoidoscopy):  Excessive amounts of blood in the stool  Significant tenderness or worsening of abdominal pains  Swelling of the abdomen that is new, acute  Fever of 100F or higher   For urgent or emergent issues, a gastroenterologist can be reached at any hour by calling [phone]. Do not use MyChart messaging for urgent concerns.    DIET:  We do recommend a small meal at first, but then you may proceed to your regular diet.  Drink plenty of fluids but you should avoid alcoholic beverages for 24 hours.  ACTIVITY:  You should plan to take it easy for the rest of today and  you should NOT DRIVE or use heavy machinery until tomorrow (because of the sedation medicines used during the test).    FOLLOW UP: Our staff will call the number listed on your records the next business day following your procedure.  We will call around 7:15- 8:00 am to check on you and address any questions or concerns that you may have regarding the information given to you following your procedure. If we do not reach you, we will leave a message.     If any biopsies were taken you will be contacted by phone or by letter within the next 1-3 weeks.  Please call us at [phone] if you have not heard about the biopsies in 3 weeks.    SIGNATURES/CONFIDENTIALITY: You and/or your care partner have signed paperwork which will be entered into your electronic medical record.  These signatures attest to the fact that that the information above on your After Visit Summary has been reviewed and is understood.  Full responsibility of the confidentiality of this discharge information lies with you and/or your care-partner. 

## 2022-06-09 NOTE — Op Note (Signed)
[facility] Patient Name: Richard Morrow Procedure Date: 06/09/2022 11:25 AM MRN: [id number] Endoscopist: Mauri Pole , MD Age: 61 Referring MD:  Date of Birth: [birth year] Gender: Male Account #: [id number] Procedure:                Colonoscopy Indications:              High risk colon cancer surveillance: Personal                            history of colonic polyps, High risk colon cancer                            surveillance: Personal history of adenoma less than                            10 mm in size Medicines:                Monitored Anesthesia Care Procedure:                Pre-Anesthesia Assessment:                           - Prior to the procedure, a History and Physical                            was performed, and patient medications and                            allergies were reviewed. The patient's tolerance of                            previous anesthesia was also reviewed. The risks                            and benefits of the procedure and the sedation                            options and risks were discussed with the patient.                            All questions were answered, and informed consent                            was obtained. Prior Anticoagulants: The patient has                            taken no previous anticoagulant or antiplatelet                            agents. ASA Grade Assessment: II - A patient with                            mild systemic disease. After reviewing the risks  and benefits, the patient was deemed in                            satisfactory condition to undergo the procedure.                           After obtaining informed consent, the colonoscope                            was passed under direct vision. Throughout the                            procedure, the patient's blood pressure, pulse, and                            oxygen saturations were monitored  continuously. The                            PCF-HQ190L Colonoscope was introduced through the                            anus and advanced to the the cecum, identified by                            appendiceal orifice and ileocecal valve. The                            colonoscopy was performed without difficulty. The                            patient tolerated the procedure well. The quality                            of the bowel preparation was good. The ileocecal                            valve, appendiceal orifice, and rectum were                            photographed. Scope In: 12:04:10 PM Scope Out: 12:14:55 PM Scope Withdrawal Time: 0 hours 8 minutes 28 seconds  Total Procedure Duration: 0 hours 10 minutes 45 seconds  Findings:                 The perianal and digital rectal examinations were                            normal.                           A 2 mm polyp was found in the descending colon. The                            polyp was sessile. The polyp was removed with a  cold biopsy forceps. Resection and retrieval were                            complete.                           Non-bleeding external and internal hemorrhoids were                            found during retroflexion. The hemorrhoids were                            small. Complications:            No immediate complications. Estimated Blood Loss:     Estimated blood loss was minimal. Impression:               - One 2 mm polyp in the descending colon, removed                            with a cold biopsy forceps. Resected and retrieved.                           - Non-bleeding external and internal hemorrhoids. Recommendation:           - Patient has a contact number available for                            emergencies. The signs and symptoms of potential                            delayed complications were discussed with the                            patient. Return to  normal activities tomorrow.                            Written discharge instructions were provided to the                            patient.                           - Resume previous diet.                           - Continue present medications.                           - Await pathology results.                           - Repeat colonoscopy in 5-10 years for surveillance                            based on pathology results. Mauri Pole, MD 06/09/2022 12:23:08 PM This report has been signed electronically.

## 2022-06-09 NOTE — Progress Notes (Signed)
[facility] History and Physical   Primary Care Physician:  Biagio Borg, MD   Reason for Procedure:  History of adenomatous colon polyps  Plan:    Surveillance colonoscopy with possible interventions as needed     HPI: Richard Morrow is a very pleasant 61 y.o. male here for surveillance colonoscopy. Denies any nausea, vomiting, abdominal pain, melena or bright red blood per rectum  The risks and benefits as well as alternatives of endoscopic procedure(s) have been discussed and reviewed. All questions answered. The patient agrees to proceed.    Past Medical History:  Diagnosis Date   ALLERGIC RHINITIS 05/18/2007   Qualifier: Diagnosis of  By: Dance CMA (AAMA), Kim     Allergy    HYPERLIPIDEMIA 05/18/2007   Qualifier: Diagnosis of  By: Dance CMA (AAMA), Kim     Vasovagal syncope    issues with needles    Past Surgical History:  Procedure Laterality Date   COLONOSCOPY     rotater cuff repair     left   VASECTOMY      Prior to Admission medications   Medication Sig Start Date End Date Taking? Authorizing Provider  aspirin EC 81 MG tablet Take 1 tablet (81 mg total) by mouth daily. Swallow whole. 02/21/22  Yes Biagio Borg, MD  atorvastatin (LIPITOR) 80 MG tablet Take 1 tablet (80 mg total) by mouth daily. 04/17/22  Yes Biagio Borg, MD  Cholecalciferol (VITAMIN D) 50 MCG (2000 UT) CAPS Take by mouth.   Yes [provider]  hydrocortisone 2.5 % cream APPLY CREAM TO AFFECTED AREA TWICE DAILY 06/18/18  Yes [provider]  triamcinolone cream (KENALOG) 0.1 % Apply 1 application topically 2 (two) times daily.   Yes [provider]    Current Outpatient Medications  Medication Sig Dispense Refill   aspirin EC 81 MG tablet Take 1 tablet (81 mg total) by mouth daily. Swallow whole. 30 tablet 12   atorvastatin (LIPITOR) 80 MG tablet Take 1 tablet (80 mg total) by mouth daily. 90 tablet 3   Cholecalciferol (VITAMIN D) 50 MCG (2000 UT)  CAPS Take by mouth.     hydrocortisone 2.5 % cream APPLY CREAM TO AFFECTED AREA TWICE DAILY     triamcinolone cream (KENALOG) 0.1 % Apply 1 application topically 2 (two) times daily.     Current Facility-Administered Medications  Medication Dose Route Frequency Provider Last Rate Last Admin   0.9 %  sodium chloride infusion  500 mL Intravenous Once Mauri Pole, MD        Allergies as of 06/09/2022   (No Known Allergies)    Family History  Problem Relation Age of Onset   Breast cancer Mother    Colon cancer Mother 64   Heart disease Mother    Liver cancer Mother    Lung cancer Mother    Cancer - Other Mother    Peripheral Artery Disease Mother    Heart disease Father    Allergic rhinitis Sister    Prostate cancer Paternal Grandfather    Alcohol abuse Other    Esophageal cancer Neg Hx    Rectal cancer Neg Hx    Stomach cancer Neg Hx    Colon polyps Neg Hx     Social History   Socioeconomic History   Marital status: Married    Spouse name: Not on file   Number of children: 2   Years of education: Not on file   Highest education level: Not  on file  Occupational History   Not on file  Tobacco Use   Smoking status: Never   Smokeless tobacco: Never  Substance and Sexual Activity   Alcohol use: Yes    Comment: Few drinks beer daily   Drug use: No   Sexual activity: Not on file  Other Topics Concern   Not on file  Social History Narrative   Accountant   Social Determinants of Health   Financial Resource Strain: Not on file  Food Insecurity: Not on file  Transportation Needs: Not on file  Physical Activity: Not on file  Stress: Not on file  Social Connections: Not on file  Intimate Partner Violence: Not on file    Review of Systems:  All other review of systems negative except as mentioned in the HPI.  Physical Exam: Vital signs in last 24 hours: BP (!) 144/101   Pulse 63   Temp 98.9 F (37.2 C)   Ht '5\' 9"'$  (1.753 m)   Wt 165 lb (74.8 kg)    SpO2 98%   BMI 24.37 kg/m  General:   Alert, NAD Lungs:  Clear .   Heart:  Regular rate and rhythm Abdomen:  Soft, nontender and nondistended. Neuro/Psych:  Alert and cooperative. Normal mood and affect. A and O x 3  Reviewed labs, radiology imaging, old records and pertinent past GI work up  Patient is appropriate for planned procedure(s) and anesthesia in an ambulatory setting   K. Denzil Magnuson , MD [phone]

## 2022-06-09 NOTE — Progress Notes (Signed)
Called to room to assist during endoscopic procedure.  Patient ID and intended procedure confirmed with present staff. Received instructions for my participation in the procedure from the performing physician.  

## 2022-06-09 NOTE — Progress Notes (Signed)
Pt's states no medical or surgical changes since previsit or office visit. 

## 2022-06-10 ENCOUNTER — Telehealth: Payer: Self-pay

## 2022-06-10 NOTE — Telephone Encounter (Signed)
  Follow up Call-     06/09/2022   10:54 AM  Call back number  Post procedure Call Back phone  # [phone]  Permission to leave phone message Yes     Left message

## 2022-06-26 ENCOUNTER — Other Ambulatory Visit: Payer: Self-pay

## 2022-06-26 ENCOUNTER — Encounter: Payer: Self-pay | Admitting: Gastroenterology

## 2022-06-26 MED ORDER — ATORVASTATIN CALCIUM 80 MG PO TABS: 80.0000 mg | ORAL_TABLET | Freq: Every day | ORAL | 3 refills | Status: AC

## 2022-07-04 ENCOUNTER — Encounter: Payer: Self-pay | Admitting: Internal Medicine

## 2022-12-18 DIAGNOSIS — Z6824 Body mass index (BMI) 24.0-24.9, adult: Secondary | ICD-10-CM | POA: Diagnosis not present

## 2022-12-18 DIAGNOSIS — Z1331 Encounter for screening for depression: Secondary | ICD-10-CM | POA: Diagnosis not present

## 2022-12-18 DIAGNOSIS — Z0000 Encounter for general adult medical examination without abnormal findings: Secondary | ICD-10-CM | POA: Diagnosis not present

## 2022-12-18 DIAGNOSIS — E785 Hyperlipidemia, unspecified: Secondary | ICD-10-CM | POA: Diagnosis not present

## 2023-01-13 ENCOUNTER — Encounter: Payer: BC Managed Care – PPO | Admitting: Internal Medicine

## 2023-03-30 IMAGING — CT CT CARDIAC CORONARY ARTERY CALCIUM SCORE
3 series · 14 of 20 positions shown, 16 images · non-contrast
Comparison: None Available.
COMPARISON: None Available.

Addendum:
EXAM:
OVER-READ INTERPRETATION  CT CHEST

The following report is a limited chest CT over-read performed by
02/21/2022. The coronary calcium score interpretation by the
cardiologist is attached.
CLINICAL DATA: Risk stratification
Coronary Calcium Score
TECHNIQUE: The patient was scanned on a Siemens Somatom 64 slice scanner. Axial
non-contrast 3 mm slices were carried out through the heart. The
data set was analyzed on a dedicated work station and scored using
the Agatson method.

[Series 2: cascseq 2.0 sa36 70% (id) · axial · 0.39mm/px · z∈[-230,-140]mm · 4 of 76 slices shown]
[im 16/76  vessel]
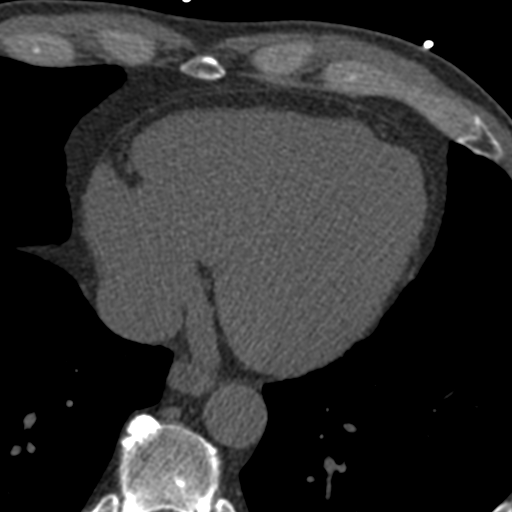
[im 31/76  vessel]
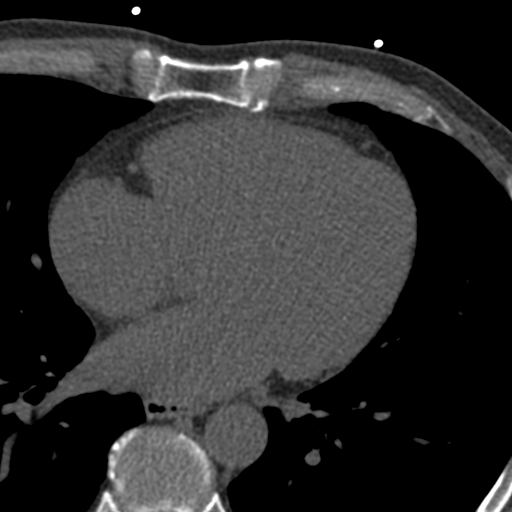
[im 46/76  vessel]
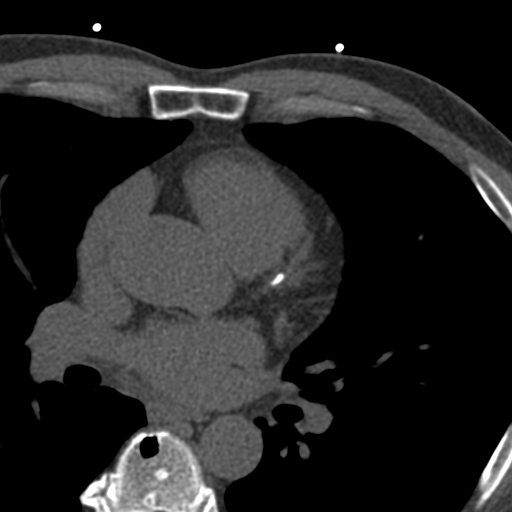
[im 61/76  vessel]
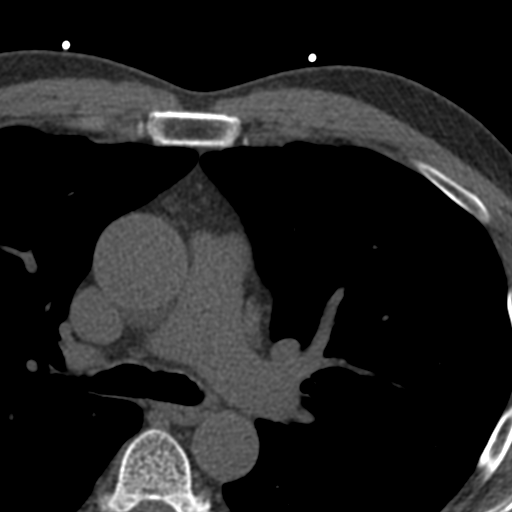

[Series 3: cascseq 2.0 bf37 st · axial · 0.75mm/px · z∈[-236,-136]mm · 5 of 76 slices shown, 7 images]
[im 13/76  vessel]
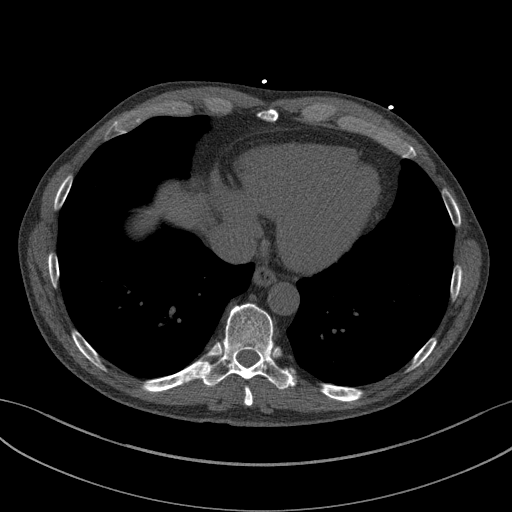
[im 13/76  lung]
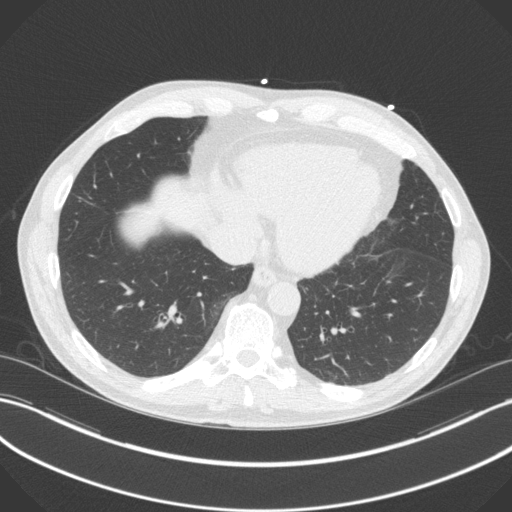
[im 26/76  vessel]
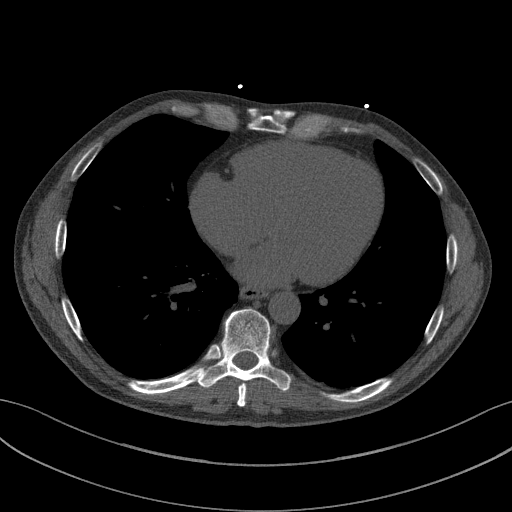
[im 38/76  vessel]
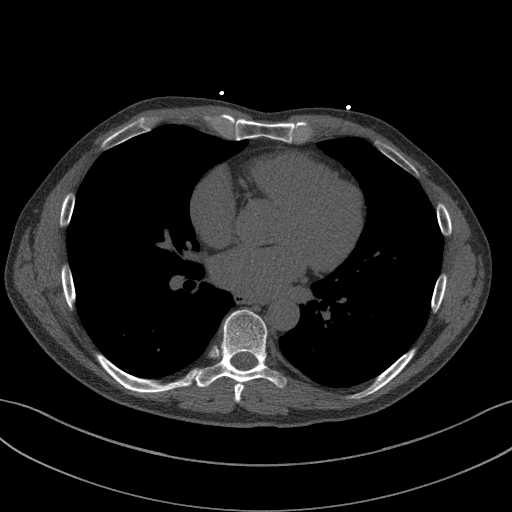
[im 51/76  vessel]
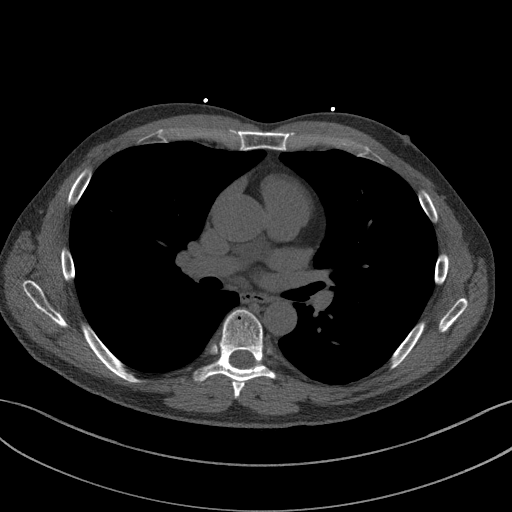
[im 63/76  vessel]
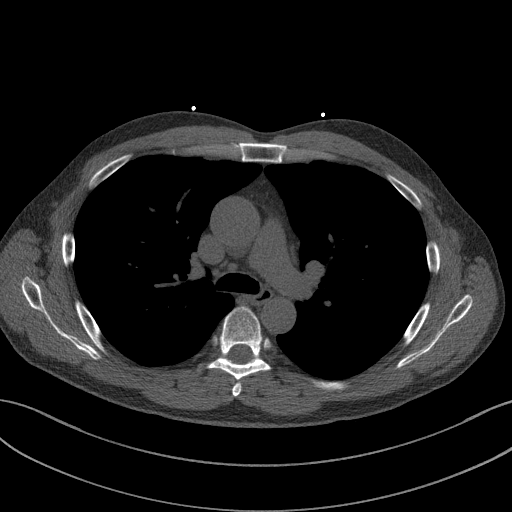
[im 63/76  lung]
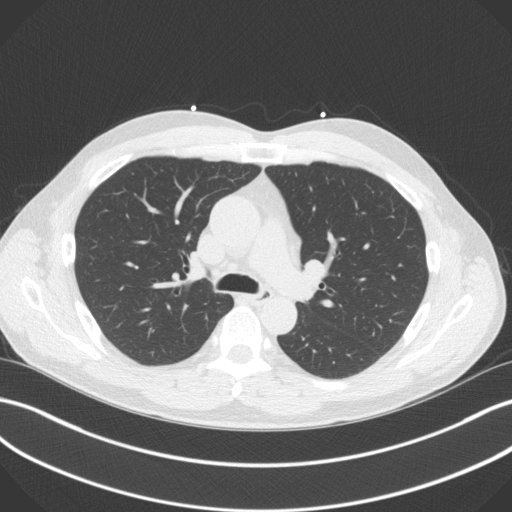

[Series 4: cascseq 2.0 br59 lung · axial · 0.75mm/px · z∈[-236,-136]mm · 5 of 76 slices shown]
[im 13/76  lung]
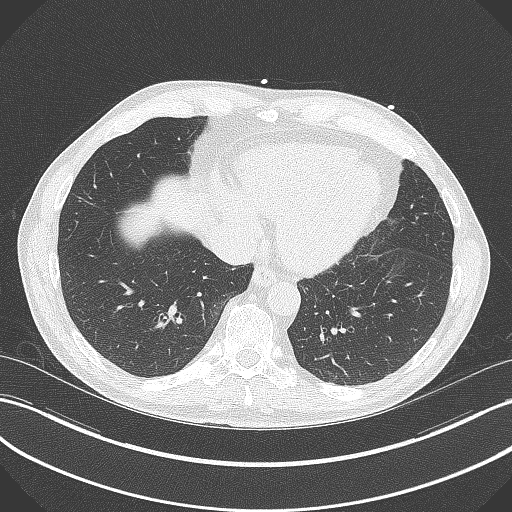
[im 26/76  lung]
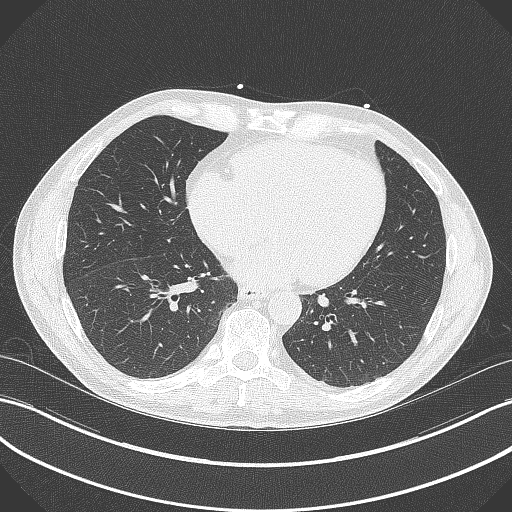
[im 38/76  lung]
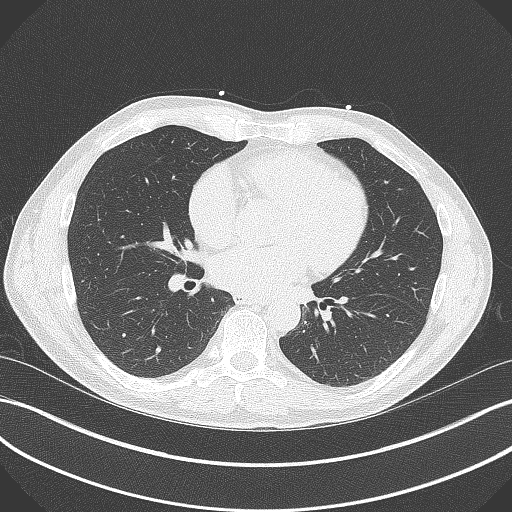
[im 51/76  lung]
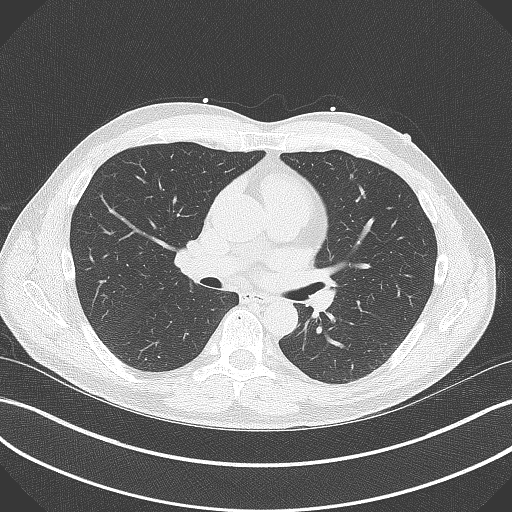
[im 63/76  lung]
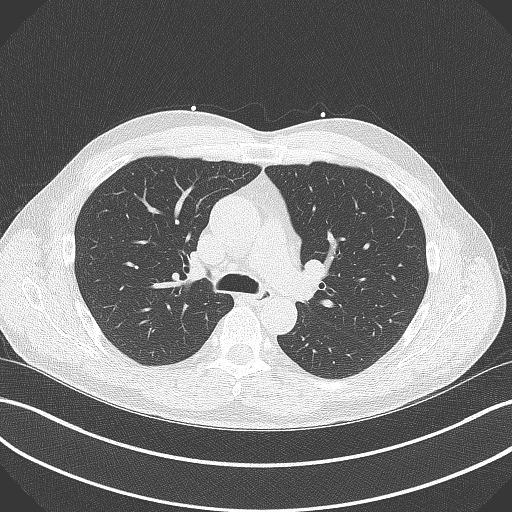

[14 of 20 positions shown; findings below may reference images not displayed]

FINDINGS: Atherosclerotic calcifications in the thoracic aorta. Within the
visualized portions of the thorax there are no suspicious appearing
pulmonary nodules or masses, there is no acute consolidative
airspace disease, no pleural effusions, no pneumothorax and no
lymphadenopathy. Visualized portions of the upper abdomen are
unremarkable. There are no aggressive appearing lytic or blastic
lesions noted in the visualized portions of the skeleton.
IMPRESSION: 1.  Aortic Atherosclerosis (PRB5Y-PQU.U).
FINDINGS: Non-cardiac: See separate report from [REDACTED].

Ascending aorta: Upper normal diameter 3.7 cm

Pericardium: Normal

Coronary arteries: Calcium noted in RCA and LAD

LM: 0

LAD

LCX: 0

RCA

Total 135
IMPRESSION: Coronary calcium score of 135. This was 75 tj percentile for age and
sex matched control.

Luckendy Bigger

*** End of Addendum ***
EXAM:
OVER-READ INTERPRETATION  CT CHEST

The following report is a limited chest CT over-read performed by
02/21/2022. The coronary calcium score interpretation by the
cardiologist is attached.
FINDINGS: Atherosclerotic calcifications in the thoracic aorta. Within the
visualized portions of the thorax there are no suspicious appearing
pulmonary nodules or masses, there is no acute consolidative
airspace disease, no pleural effusions, no pneumothorax and no
lymphadenopathy. Visualized portions of the upper abdomen are
unremarkable. There are no aggressive appearing lytic or blastic
lesions noted in the visualized portions of the skeleton.
IMPRESSION: 1.  Aortic Atherosclerosis (PRB5Y-PQU.U).
